# Patient Record
Sex: Female | Born: 1995 | Race: Black or African American | Hispanic: No | Marital: Single | Smoking: Never smoker
Health system: Southern US, Community
[De-identification: ages and names within clinical notes are randomized; demographics above are authoritative.]

## PROBLEM LIST (undated history)

## (undated) ENCOUNTER — Ambulatory Visit (HOSPITAL_COMMUNITY): Disposition: A | Discharge status: Home / Self Care | Payer: Medicaid Other

## (undated) DIAGNOSIS — O24419 Gestational diabetes mellitus in pregnancy, unspecified control: Secondary | ICD-10-CM

## (undated) DIAGNOSIS — Z8619 Personal history of other infectious and parasitic diseases: Secondary | ICD-10-CM

## (undated) HISTORY — PX: NO PAST SURGERIES: SHX2092

---
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
## 2018-11-05 DIAGNOSIS — O09299 Supervision of pregnancy with other poor reproductive or obstetric history, unspecified trimester: Secondary | ICD-10-CM

## 2018-11-05 HISTORY — DX: Supervision of pregnancy with other poor reproductive or obstetric history, unspecified trimester: O09.299

## 2018-11-05 NOTE — L&D Delivery Note (Signed)
Operative Delivery Note   At 5:26 PM a viable female, named Tara Nguyen,  was delivered via Vaginal, Spontaneous.  Presentation: vertex; Position: Left,, Occiput,, Anterior  Delivery of the head: 08/27/2019  5:24 PM First maneuver: 08/27/2019  5:24 PM, McRoberts Second maneuver: 08/27/2019  5:24 PM, Suprapubic Pressure Third maneuver: 08/27/2019  5:25 PM Woods Screw maneuver Fourth maneuver: 08/27/19 5:25 PM Failed attempt at posterior arm delivery Fifth maneuver: 08/27/19 5:25 PM Successful Woods Screw maneuver      APGAR: 4, 9; weight 3705 g ( 8 lbs 3 oz) Placenta status: spontaneous, complete, 3 Vx cord Cord pH: 7.08  Anesthesia:  Epidural Episiotomy: Median Lacerations: None Suture Repair: 3.0 Monocryl Est. Blood Loss (mL): 350  Mom to postpartum.  Baby to Couplet care / Skin to Skin. Outpatient circumcision desired Breastfeeding  Course of event:    Poorly controlled Class A2 diabetic patient, with presumed AGA baby,admitted in spontaneous labor. New diagnosis of pre-eclampsia with severe features during labor. Patient was complete at 16:36 I was present in the room for the whole 2nd stage of labor Patient pushed very efficiently and rapidly brought the baby's head to crowning. Turtle sign was recognized and 2 more nurses were called in the room for assistance.I informed everyone of the presentation of the baby in LOA and suspicion of imminent shoulder dystocia. Patient was already in [place] position. I performed a midline episiotomy. Head was delivered at 5:24 PM.  After short gentle traction downward, shoulder dystocia was recognized. NICU team and more assistance were requested with emergency call button I requested  Suprapubic pressure. I then attempted to move the anterior shoulder obliquely without success. Maneuvers were then performed as described while time countdown was given to me every 30 seconds. Shoulder delivery from head delivery: 1 minute 30 seconds After cord  clamping/cutting, baby was given to the NICU team in the room.  After repair of perineum was complete, I reviewed all delivery events with patient and accompanying cousin: per NICU evaluation, no clavicle fracture was identified. Movement of the right arm were noted although less than left arm. Bruising of the right ear and forehead was noted. Patient is informed that pediatrician will evaluate further. Questions were answered.  [name] 08/27/2019, 6:45 PM

## 2019-03-13 LAB — OB RESULTS CONSOLE HIV ANTIBODY (ROUTINE TESTING): HIV: NONREACTIVE

## 2019-03-13 LAB — OB RESULTS CONSOLE ABO/RH: RH Type: POSITIVE

## 2019-03-13 LAB — OB RESULTS CONSOLE GC/CHLAMYDIA
Chlamydia: NEGATIVE
Gonorrhea: NEGATIVE

## 2019-03-13 LAB — OB RESULTS CONSOLE RUBELLA ANTIBODY, IGM: Rubella: IMMUNE

## 2019-03-13 LAB — OB RESULTS CONSOLE HEPATITIS B SURFACE ANTIGEN: Hepatitis B Surface Ag: NEGATIVE

## 2019-03-13 LAB — OB RESULTS CONSOLE ANTIBODY SCREEN: Antibody Screen: NEGATIVE

## 2019-03-13 LAB — OB RESULTS CONSOLE RPR: RPR: NONREACTIVE

## 2019-07-07 DIAGNOSIS — O24419 Gestational diabetes mellitus in pregnancy, unspecified control: Secondary | ICD-10-CM

## 2019-07-07 HISTORY — DX: Gestational diabetes mellitus in pregnancy, unspecified control: O24.419

## 2019-07-29 ENCOUNTER — Encounter: Payer: Medicaid Other | Attending: Obstetrics and Gynecology | Admitting: Dietician

## 2019-07-29 ENCOUNTER — Encounter: Payer: Self-pay | Admitting: Dietician

## 2019-07-29 ENCOUNTER — Other Ambulatory Visit: Payer: Self-pay

## 2019-07-29 DIAGNOSIS — O24414 Gestational diabetes mellitus in pregnancy, insulin controlled: Secondary | ICD-10-CM | POA: Insufficient documentation

## 2019-07-29 NOTE — Progress Notes (Signed)
  Patient was seen on 07/29/2019 for Gestational Diabetes self-management class at the [facility]. The following learning objectives were met by the patient during this course:   States the definition of Gestational Diabetes  States why dietary management is important in controlling blood glucose  Describes the effects each nutrient has on blood glucose levels  Demonstrates ability to create a balanced meal plan  Demonstrates carbohydrate counting   States when to check blood glucose levels  Demonstrates proper blood glucose monitoring techniques  States the effect of stress and exercise on blood glucose levels  States the importance of limiting caffeine and abstaining from alcohol and smoking  Blood glucose monitor given: none Blood glucose reading: 267 in class.  She reports fasting this am was 122, and BG was 88 last night.  She states BG of 267 was due to sweetened drink that she brought to class.  Frequent urination due to increased BG.  Discussed to inform MD.  Patient instructed to monitor glucose levels: FBS: 60 - <90 1 hour: <140 2 hour: <120  *Patient received handouts:  Nutrition Diabetes and Pregnancy  Carbohydrate Counting List  Patient will be seen for follow-up as needed.

## 2019-08-06 ENCOUNTER — Inpatient Hospital Stay (HOSPITAL_COMMUNITY): Payer: Medicaid Other

## 2019-08-06 ENCOUNTER — Inpatient Hospital Stay (HOSPITAL_COMMUNITY)
Admission: AD | Admit: 2019-08-06 | Discharge: 2019-08-06 | Disposition: A | Discharge status: Home / Self Care | Payer: Medicaid Other | Attending: Obstetrics & Gynecology | Admitting: Obstetrics & Gynecology

## 2019-08-06 ENCOUNTER — Encounter (HOSPITAL_COMMUNITY): Payer: Self-pay

## 2019-08-06 DIAGNOSIS — O26893 Other specified pregnancy related conditions, third trimester: Secondary | ICD-10-CM | POA: Diagnosis not present

## 2019-08-06 DIAGNOSIS — Z3689 Encounter for other specified antenatal screening: Secondary | ICD-10-CM

## 2019-08-06 DIAGNOSIS — O99891 Other specified diseases and conditions complicating pregnancy: Secondary | ICD-10-CM | POA: Diagnosis not present

## 2019-08-06 DIAGNOSIS — O24414 Gestational diabetes mellitus in pregnancy, insulin controlled: Secondary | ICD-10-CM | POA: Diagnosis not present

## 2019-08-06 DIAGNOSIS — Z3A35 35 weeks gestation of pregnancy: Secondary | ICD-10-CM | POA: Diagnosis not present

## 2019-08-06 DIAGNOSIS — M549 Dorsalgia, unspecified: Secondary | ICD-10-CM | POA: Insufficient documentation

## 2019-08-06 LAB — CBC
HCT: 34.5 % — ABNORMAL LOW (ref 36.0–46.0)
Hemoglobin: 11.3 g/dL — ABNORMAL LOW (ref 12.0–15.0)
MCH: 26 pg (ref 26.0–34.0)
MCHC: 32.8 g/dL (ref 30.0–36.0)
MCV: 79.3 fL — ABNORMAL LOW (ref 80.0–100.0)
Platelets: 284 10*3/uL (ref 150–400)
RBC: 4.35 MIL/uL (ref 3.87–5.11)
RDW: 14.4 % (ref 11.5–15.5)
WBC: 11.4 10*3/uL — ABNORMAL HIGH (ref 4.0–10.5)
nRBC: 0 % (ref 0.0–0.2)

## 2019-08-06 LAB — COMPREHENSIVE METABOLIC PANEL
ALT: 14 U/L (ref 0–44)
AST: 20 U/L (ref 15–41)
Albumin: 2.8 g/dL — ABNORMAL LOW (ref 3.5–5.0)
Alkaline Phosphatase: 108 U/L (ref 38–126)
Anion gap: 12 (ref 5–15)
BUN: 5 mg/dL — ABNORMAL LOW (ref 6–20)
CO2: 19 mmol/L — ABNORMAL LOW (ref 22–32)
Calcium: 9 mg/dL (ref 8.9–10.3)
Chloride: 104 mmol/L (ref 98–111)
Creatinine, Ser: 0.76 mg/dL (ref 0.44–1.00)
GFR calc Af Amer: >60 mL/min (ref >60)
GFR calc non Af Amer: >60 mL/min (ref >60)
Glucose, Bld: 88 mg/dL (ref 70–99)
Potassium: 4 mmol/L (ref 3.5–5.1)
Sodium: 135 mmol/L (ref 135–145)
Total Bilirubin: 0.4 mg/dL (ref 0.3–1.2)
Total Protein: 6 g/dL — ABNORMAL LOW (ref 6.5–8.1)

## 2019-08-06 LAB — URINALYSIS, ROUTINE W REFLEX MICROSCOPIC
Bilirubin Urine: NEGATIVE
Glucose, UA: NEGATIVE mg/dL
Hgb urine dipstick: NEGATIVE
Ketones, ur: 20 mg/dL — AB
Leukocytes,Ua: NEGATIVE
Nitrite: NEGATIVE
Protein, ur: NEGATIVE mg/dL
Specific Gravity, Urine: 1.011 (ref 1.005–1.030)
pH: 8 (ref 5.0–8.0)

## 2019-08-06 LAB — GLUCOSE, CAPILLARY: Glucose-Capillary: 72 mg/dL (ref 70–99)

## 2019-08-06 MED ORDER — TRAMADOL-ACETAMINOPHEN 37.5-325 MG PO TABS
2.0000 | ORAL_TABLET | ORAL | Status: DC | PRN
  Filled 2019-08-06: qty 2

## 2019-08-06 MED ORDER — CYCLOBENZAPRINE HCL 10 MG PO TABS: 10.0000 mg | ORAL_TABLET | Freq: Two times a day (BID) | ORAL | 0 refills | Status: DC | PRN

## 2019-08-06 MED ORDER — CYCLOBENZAPRINE HCL 10 MG PO TABS
10.0000 mg | ORAL_TABLET | Freq: Once | ORAL | Status: AC
  Administered 2019-08-06: 15:00:00 10 mg via ORAL
  Filled 2019-08-06: qty 1

## 2019-08-06 MED ORDER — HYDROMORPHONE HCL 1 MG/ML IJ SOLN
0.5000 mg | Freq: Once | INTRAMUSCULAR | Status: AC
  Administered 2019-08-06: 0.5 mg via INTRAVENOUS
  Filled 2019-08-06: qty 1

## 2019-08-06 NOTE — MAU Note (Signed)
Pt complaining of contractions since 0300 this morning which have gotten worse. Unsure of how far apart. Denies LOF or bleeding. +FM/.

## 2019-08-06 NOTE — MAU Provider Note (Addendum)
Patient Tara Nguyen is a 23 y.o. G1P0 At [redacted]w[redacted]d here with complaints of  Contractions and sharp right flank pain.  She denies bleeding, decreased fetal movements, LOF. She denies HA, blurry vision or other ob-gyn complaint. She is a GDMA-2 on insulin, she is poorly controlled per Diabetic Educator notes.    History     CSN: [id number]  Arrival date and time: 08/06/19 1156   First Provider Initiated Contact with Patient 08/06/19 1219      Chief Complaint  Patient presents with  . Contractions   Back Pain This is a new problem. The current episode started yesterday. The problem occurs constantly. The problem has been resolved since onset. The quality of the pain is described as shooting. The pain does not radiate. The pain is at a severity of 10/10. Pertinent negatives include no abdominal pain, chest pain, dysuria or fever.   Patient presented to MAU in severe distress, unable to sit still on bed and crying out. She is writhing on the bed and saying she thinks she is in labor.   OB History    Gravida  1   Para      Term      Preterm      AB      Living        SAB      TAB      Ectopic      Multiple      Live Births              Past Medical History:  Diagnosis Date  . GDM (gestational diabetes mellitus) 07/2019    Past Surgical History:  Procedure Laterality Date  . NO PAST SURGERIES      No family history on file.  Social History   Tobacco Use  . Smoking status: Not on file  Substance Use Topics  . Alcohol use: Not on file  . Drug use: Not on file    Allergies: Not on File  Medications Prior to Admission  Medication Sig Dispense Refill Last Dose  . Prenatal Vit-Fe Fumarate-FA (PRENATAL MULTIVITAMIN) TABS tablet Take 1 tablet by mouth daily at 12 noon.   08/06/2019 at Unknown time  . insulin NPH Human (NOVOLIN N) 100 UNIT/ML injection Inject 10 Units into the skin at bedtime.       Review of Systems  Constitutional: Negative for fever.   Cardiovascular: Negative for chest pain.  Gastrointestinal: Negative for abdominal pain.  Genitourinary: Negative for dysuria.  Musculoskeletal: Positive for back pain.   Physical Exam   Blood pressure 120/79, pulse 79, temperature 97.9 F (36.6 C), temperature source Oral, resp. rate 18.  Physical Exam  Constitutional: She is oriented to person, place, and time. She appears well-developed and well-nourished.  HENT:  Head: Normocephalic.  Eyes: Pupils are equal, round, and reactive to light.  Neck: Normal range of motion.  Respiratory: Effort normal.  GI: Soft.  Genitourinary:    Genitourinary Comments: NEFG; cervix is FT, closed, thick.    Musculoskeletal: Normal range of motion.  Neurological: She is alert and oriented to person, place, and time.  Skin: Skin is warm and dry.  Patient appears to have some CVA tenderness on the right side.   MAU Course  Procedures  MDM Upon arrival in MAU, patient crying out in pain, IV started and stat labs drawn; once patient was informed that she was not in labor she appears more calm but still required pain medicine. She began asking  for food, a blanket and wanted to go to sleep. Because patient presented so acutely, she received 0.5 mg  of dilaudid before she went to [place] -US shows no signs of kidney stones -patient to get Flexeril and check blood sugars -If no relief, will try tramadol -Bedside CBG is 72 -CBC, UA, CMP normal. No signs of infection.  -NST: 125 bpm, mod var, present acel, neg decels, no contractions.  -Patient feels better after Flexeril; pain is now a 0/10. Patient is resting in bed , lying on her right side and reading on her phone.  Patient states "I think the baby is kicking me a lot; I think he was just in a bad position".  Patient's cervix was closed, FT and long on initial presentation to MAU, not rechecked before she left as patient had no complaint of pain.   Assessment and Plan   1. Back pain affecting  pregnancy, antepartum    -RX for Flexeril given, reassured patient that it is most likely MSK pain. Given that her pain responded will to Flexeril, she can try heat and flexeril at home. Reassured her of the normalcy of back pain at this gestation.  -She will keep follow up appt next week with [facility].  -Strict return precautions given; continue to follow insulin as prescribed by her OB.  Charlesetta Garibaldi Rayen Palen 08/06/2019, 2:47 PM

## 2019-08-06 NOTE — Discharge Instructions (Signed)

## 2019-08-10 LAB — OB RESULTS CONSOLE GBS: GBS: NEGATIVE

## 2019-08-25 ENCOUNTER — Encounter (HOSPITAL_COMMUNITY): Payer: Self-pay | Admitting: *Deleted

## 2019-08-25 ENCOUNTER — Telehealth (HOSPITAL_COMMUNITY): Payer: Self-pay | Admitting: *Deleted

## 2019-08-25 NOTE — Telephone Encounter (Signed)
Preadmission screen  

## 2019-08-27 ENCOUNTER — Inpatient Hospital Stay (HOSPITAL_COMMUNITY): Payer: Medicaid Other | Admitting: Anesthesiology

## 2019-08-27 ENCOUNTER — Other Ambulatory Visit: Payer: Self-pay

## 2019-08-27 ENCOUNTER — Inpatient Hospital Stay (HOSPITAL_COMMUNITY)
Admission: AD | Admit: 2019-08-27 | Discharge: 2019-08-30 | DRG: 807 | Disposition: A | Discharge status: Home / Self Care | Payer: Medicaid Other | Attending: Obstetrics & Gynecology | Admitting: Obstetrics & Gynecology

## 2019-08-27 ENCOUNTER — Encounter (HOSPITAL_COMMUNITY): Payer: Self-pay

## 2019-08-27 DIAGNOSIS — Z3A38 38 weeks gestation of pregnancy: Secondary | ICD-10-CM

## 2019-08-27 DIAGNOSIS — O26893 Other specified pregnancy related conditions, third trimester: Secondary | ICD-10-CM | POA: Diagnosis present

## 2019-08-27 DIAGNOSIS — O1495 Unspecified pre-eclampsia, complicating the puerperium: Secondary | ICD-10-CM | POA: Diagnosis not present

## 2019-08-27 DIAGNOSIS — O24424 Gestational diabetes mellitus in childbirth, insulin controlled: Secondary | ICD-10-CM | POA: Diagnosis present

## 2019-08-27 DIAGNOSIS — L509 Urticaria, unspecified: Secondary | ICD-10-CM | POA: Diagnosis not present

## 2019-08-27 DIAGNOSIS — O99214 Obesity complicating childbirth: Secondary | ICD-10-CM | POA: Diagnosis present

## 2019-08-27 DIAGNOSIS — O1415 Severe pre-eclampsia, complicating the puerperium: Secondary | ICD-10-CM | POA: Diagnosis present

## 2019-08-27 DIAGNOSIS — E669 Obesity, unspecified: Secondary | ICD-10-CM | POA: Diagnosis present

## 2019-08-27 DIAGNOSIS — Z20828 Contact with and (suspected) exposure to other viral communicable diseases: Secondary | ICD-10-CM | POA: Diagnosis present

## 2019-08-27 DIAGNOSIS — O24419 Gestational diabetes mellitus in pregnancy, unspecified control: Secondary | ICD-10-CM | POA: Diagnosis present

## 2019-08-27 LAB — CBC
HCT: 34.4 % — ABNORMAL LOW (ref 36.0–46.0)
HCT: 34.2 % — ABNORMAL LOW (ref 36.0–46.0)
Hemoglobin: 10.9 g/dL — ABNORMAL LOW (ref 12.0–15.0)
Hemoglobin: 11.1 g/dL — ABNORMAL LOW (ref 12.0–15.0)
MCH: 24.8 pg — ABNORMAL LOW (ref 26.0–34.0)
MCH: 25.2 pg — ABNORMAL LOW (ref 26.0–34.0)
MCHC: 31.7 g/dL (ref 30.0–36.0)
MCHC: 32.5 g/dL (ref 30.0–36.0)
MCV: 78.2 fL — ABNORMAL LOW (ref 80.0–100.0)
MCV: 77.7 fL — ABNORMAL LOW (ref 80.0–100.0)
Platelets: 278 10*3/uL (ref 150–400)
Platelets: 268 10*3/uL (ref 150–400)
RBC: 4.4 MIL/uL (ref 3.87–5.11)
RBC: 4.4 MIL/uL (ref 3.87–5.11)
RDW: 14.9 % (ref 11.5–15.5)
RDW: 15.1 % (ref 11.5–15.5)
WBC: 10.3 10*3/uL (ref 4.0–10.5)
WBC: 15.7 10*3/uL — ABNORMAL HIGH (ref 4.0–10.5)
nRBC: 0 % (ref 0.0–0.2)
nRBC: 0 % (ref 0.0–0.2)

## 2019-08-27 LAB — GLUCOSE, CAPILLARY
Glucose-Capillary: 111 mg/dL — ABNORMAL HIGH (ref 70–99)
Glucose-Capillary: 115 mg/dL — ABNORMAL HIGH (ref 70–99)

## 2019-08-27 LAB — SARS CORONAVIRUS 2 BY RT PCR (HOSPITAL ORDER, PERFORMED IN ~~LOC~~ HOSPITAL LAB): SARS Coronavirus 2: NEGATIVE

## 2019-08-27 LAB — TYPE AND SCREEN
ABO/RH(D): A POS
Antibody Screen: NEGATIVE

## 2019-08-27 LAB — COMPREHENSIVE METABOLIC PANEL
ALT: 15 U/L (ref 0–44)
AST: 18 U/L (ref 15–41)
Albumin: 2.8 g/dL — ABNORMAL LOW (ref 3.5–5.0)
Alkaline Phosphatase: 130 U/L — ABNORMAL HIGH (ref 38–126)
Anion gap: 13 (ref 5–15)
BUN: 6 mg/dL (ref 6–20)
CO2: 21 mmol/L — ABNORMAL LOW (ref 22–32)
Calcium: 8.6 mg/dL — ABNORMAL LOW (ref 8.9–10.3)
Chloride: 104 mmol/L (ref 98–111)
Creatinine, Ser: 0.9 mg/dL (ref 0.44–1.00)
GFR calc Af Amer: >60 mL/min (ref >60)
GFR calc non Af Amer: >60 mL/min (ref >60)
Glucose, Bld: 116 mg/dL — ABNORMAL HIGH (ref 70–99)
Potassium: 3.8 mmol/L (ref 3.5–5.1)
Sodium: 138 mmol/L (ref 135–145)
Total Bilirubin: 0.7 mg/dL (ref 0.3–1.2)
Total Protein: 6 g/dL — ABNORMAL LOW (ref 6.5–8.1)

## 2019-08-27 LAB — RPR: RPR Ser Ql: NONREACTIVE

## 2019-08-27 LAB — PROTEIN / CREATININE RATIO, URINE
Creatinine, Urine: 50.76 mg/dL
Protein Creatinine Ratio: 0.59 mg/mg{Cre} — ABNORMAL HIGH (ref 0.00–0.15)
Total Protein, Urine: 30 mg/dL

## 2019-08-27 LAB — ABO/RH: ABO/RH(D): A POS

## 2019-08-27 MED ORDER — MAGNESIUM SULFATE 2 GM/50ML IV SOLN: 2.0000 g | Freq: Once | INTRAVENOUS | Status: DC

## 2019-08-27 MED ORDER — LABETALOL HCL 5 MG/ML IV SOLN
20.0000 mg | INTRAVENOUS | Status: DC | PRN
  Administered 2019-08-27 (×2): 20 mg via INTRAVENOUS
  Filled 2019-08-27 (×2): qty 4

## 2019-08-27 MED ORDER — CEPHALEXIN 500 MG PO CAPS
500.0000 mg | ORAL_CAPSULE | Freq: Two times a day (BID) | ORAL | Status: DC
  Administered 2019-08-28 – 2019-08-29 (×5): 500 mg via ORAL
  Filled 2019-08-27 (×6): qty 1

## 2019-08-27 MED ORDER — PRENATAL MULTIVITAMIN CH
1.0000 | ORAL_TABLET | Freq: Every day | ORAL | Status: DC
  Administered 2019-08-28 – 2019-08-29 (×2): 1 via ORAL
  Filled 2019-08-27 (×2): qty 1

## 2019-08-27 MED ORDER — WITCH HAZEL-GLYCERIN EX PADS: 1.0000 "application " | MEDICATED_PAD | CUTANEOUS | Status: DC | PRN

## 2019-08-27 MED ORDER — DIPHENHYDRAMINE HCL 25 MG PO CAPS
25.0000 mg | ORAL_CAPSULE | Freq: Four times a day (QID) | ORAL | Status: DC | PRN
  Administered 2019-08-29 – 2019-08-30 (×5): 25 mg via ORAL
  Filled 2019-08-27 (×5): qty 1

## 2019-08-27 MED ORDER — FENTANYL-BUPIVACAINE-NACL 0.5-0.125-0.9 MG/250ML-% EP SOLN: 12.0000 mL/h | EPIDURAL | Status: DC | PRN

## 2019-08-27 MED ORDER — LIDOCAINE HCL (PF) 1 % IJ SOLN
INTRAMUSCULAR | Status: DC | PRN
  Administered 2019-08-27 (×2): 4 mL via EPIDURAL

## 2019-08-27 MED ORDER — OXYCODONE-ACETAMINOPHEN 5-325 MG PO TABS: 1.0000 | ORAL_TABLET | ORAL | Status: DC | PRN

## 2019-08-27 MED ORDER — LACTATED RINGERS IV SOLN
500.0000 mL | INTRAVENOUS | Status: DC | PRN
  Administered 2019-08-27: 500 mL via INTRAVENOUS

## 2019-08-27 MED ORDER — MEASLES, MUMPS & RUBELLA VAC IJ SOLR: 0.5000 mL | Freq: Once | INTRAMUSCULAR | Status: DC

## 2019-08-27 MED ORDER — EPHEDRINE 5 MG/ML INJ: 10.0000 mg | INTRAVENOUS | Status: DC | PRN

## 2019-08-27 MED ORDER — ONDANSETRON HCL 4 MG/2ML IJ SOLN: 4.0000 mg | INTRAMUSCULAR | Status: DC | PRN

## 2019-08-27 MED ORDER — ONDANSETRON HCL 4 MG/2ML IJ SOLN: 4.0000 mg | Freq: Four times a day (QID) | INTRAMUSCULAR | Status: DC | PRN

## 2019-08-27 MED ORDER — LIDOCAINE HCL (PF) 1 % IJ SOLN: 30.0000 mL | INTRAMUSCULAR | Status: DC | PRN

## 2019-08-27 MED ORDER — BUPIVACAINE HCL (PF) 0.25 % IJ SOLN
INTRAMUSCULAR | Status: DC | PRN
  Administered 2019-08-27: 7 mL via EPIDURAL

## 2019-08-27 MED ORDER — IBUPROFEN 600 MG PO TABS
600.0000 mg | ORAL_TABLET | Freq: Four times a day (QID) | ORAL | Status: DC
  Administered 2019-08-28 – 2019-08-30 (×8): 600 mg via ORAL
  Filled 2019-08-27 (×8): qty 1

## 2019-08-27 MED ORDER — ENOXAPARIN SODIUM 40 MG/0.4ML ~~LOC~~ SOLN
40.0000 mg | SUBCUTANEOUS | Status: DC
  Administered 2019-08-28 – 2019-08-30 (×3): 40 mg via SUBCUTANEOUS
  Filled 2019-08-27 (×3): qty 0.4

## 2019-08-27 MED ORDER — LACTATED RINGERS IV SOLN
500.0000 mL | Freq: Once | INTRAVENOUS | Status: AC
  Administered 2019-08-27: 500 mL via INTRAVENOUS

## 2019-08-27 MED ORDER — FLEET ENEMA 7-19 GM/118ML RE ENEM: 1.0000 | ENEMA | RECTAL | Status: DC | PRN

## 2019-08-27 MED ORDER — TETANUS-DIPHTH-ACELL PERTUSSIS 5-2.5-18.5 LF-MCG/0.5 IM SUSP: 0.5000 mL | Freq: Once | INTRAMUSCULAR | Status: DC

## 2019-08-27 MED ORDER — LACTATED RINGERS IV SOLN
INTRAVENOUS | Status: DC
  Administered 2019-08-27 (×3): via INTRAVENOUS

## 2019-08-27 MED ORDER — DIBUCAINE (PERIANAL) 1 % EX OINT: 1.0000 "application " | TOPICAL_OINTMENT | CUTANEOUS | Status: DC | PRN

## 2019-08-27 MED ORDER — DIPHENHYDRAMINE HCL 50 MG/ML IJ SOLN: 12.5000 mg | INTRAMUSCULAR | Status: DC | PRN

## 2019-08-27 MED ORDER — PHENYLEPHRINE 40 MCG/ML (10ML) SYRINGE FOR IV PUSH (FOR BLOOD PRESSURE SUPPORT): 80.0000 ug | PREFILLED_SYRINGE | INTRAVENOUS | Status: DC | PRN

## 2019-08-27 MED ORDER — SODIUM CHLORIDE (PF) 0.9 % IJ SOLN
INTRAMUSCULAR | Status: DC | PRN
  Administered 2019-08-27: 11 mL/h via EPIDURAL

## 2019-08-27 MED ORDER — ACETAMINOPHEN 325 MG PO TABS: 650.0000 mg | ORAL_TABLET | ORAL | Status: DC | PRN

## 2019-08-27 MED ORDER — OXYTOCIN BOLUS FROM INFUSION
500.0000 mL | Freq: Once | INTRAVENOUS | Status: AC
  Administered 2019-08-27: 500 mL via INTRAVENOUS

## 2019-08-27 MED ORDER — COCONUT OIL OIL: 1.0000 "application " | TOPICAL_OIL | Status: DC | PRN

## 2019-08-27 MED ORDER — ACETAMINOPHEN 325 MG PO TABS
650.0000 mg | ORAL_TABLET | ORAL | Status: DC | PRN
  Administered 2019-08-28 (×2): 650 mg via ORAL
  Filled 2019-08-27 (×2): qty 2

## 2019-08-27 MED ORDER — LACTATED RINGERS IV SOLN: 500.0000 mL | Freq: Once | INTRAVENOUS | Status: DC

## 2019-08-27 MED ORDER — FENTANYL-BUPIVACAINE-NACL 0.5-0.125-0.9 MG/250ML-% EP SOLN
EPIDURAL | Status: AC
  Filled 2019-08-27: qty 250

## 2019-08-27 MED ORDER — SIMETHICONE 80 MG PO CHEW: 80.0000 mg | CHEWABLE_TABLET | ORAL | Status: DC | PRN

## 2019-08-27 MED ORDER — MAGNESIUM SULFATE BOLUS VIA INFUSION
4.0000 g | Freq: Once | INTRAVENOUS | Status: AC
  Administered 2019-08-27: 4 g via INTRAVENOUS
  Filled 2019-08-27: qty 1000

## 2019-08-27 MED ORDER — MAGNESIUM SULFATE 40 GM/1000ML IV SOLN
2.0000 g/h | INTRAVENOUS | Status: DC
  Administered 2019-08-27 – 2019-08-28 (×2): 2 g/h via INTRAVENOUS
  Filled 2019-08-27 (×2): qty 1000

## 2019-08-27 MED ORDER — PHENYLEPHRINE 40 MCG/ML (10ML) SYRINGE FOR IV PUSH (FOR BLOOD PRESSURE SUPPORT)
PREFILLED_SYRINGE | INTRAVENOUS | Status: AC
  Filled 2019-08-27: qty 10

## 2019-08-27 MED ORDER — MAGNESIUM SULFATE 40 GM/1000ML IV SOLN: 2.0000 g/h | Freq: Once | INTRAVENOUS | Status: DC

## 2019-08-27 MED ORDER — OXYTOCIN 40 UNITS IN NORMAL SALINE INFUSION - SIMPLE MED
2.5000 [IU]/h | INTRAVENOUS | Status: DC
  Filled 2019-08-27: qty 1000

## 2019-08-27 MED ORDER — OXYCODONE-ACETAMINOPHEN 5-325 MG PO TABS: 2.0000 | ORAL_TABLET | ORAL | Status: DC | PRN

## 2019-08-27 MED ORDER — BENZOCAINE-MENTHOL 20-0.5 % EX AERO
1.0000 "application " | INHALATION_SPRAY | CUTANEOUS | Status: DC | PRN
  Filled 2019-08-27: qty 56

## 2019-08-27 MED ORDER — SENNOSIDES-DOCUSATE SODIUM 8.6-50 MG PO TABS
2.0000 | ORAL_TABLET | ORAL | Status: DC
  Administered 2019-08-28 – 2019-08-29 (×2): 2 via ORAL
  Filled 2019-08-27 (×2): qty 2

## 2019-08-27 MED ORDER — SOD CITRATE-CITRIC ACID 500-334 MG/5ML PO SOLN: 30.0000 mL | ORAL | Status: DC | PRN

## 2019-08-27 MED ORDER — FERROUS SULFATE 325 (65 FE) MG PO TABS
325.0000 mg | ORAL_TABLET | Freq: Two times a day (BID) | ORAL | Status: DC
  Administered 2019-08-28 – 2019-08-30 (×5): 325 mg via ORAL
  Filled 2019-08-27 (×5): qty 1

## 2019-08-27 MED ORDER — ZOLPIDEM TARTRATE 5 MG PO TABS: 5.0000 mg | ORAL_TABLET | Freq: Every evening | ORAL | Status: DC | PRN

## 2019-08-27 MED ORDER — ONDANSETRON HCL 4 MG PO TABS: 4.0000 mg | ORAL_TABLET | ORAL | Status: DC | PRN

## 2019-08-27 NOTE — Consult Note (Signed)
Neonatology Note:   Attendance at Delivery:    I was asked by Dr. Cletis Media to attend this delivery at term for shoulder dystocia. The mother is a G2P0010, GBS neg with good prenatal care complicated by GDM on insulin, obesity and previous THC use. ROM 12h 23m prior to delivery, fluid clear. Infant apneic and hypotonic at birth.  Immediately broughtt to warmer and dreed and stimulated.  HR <60.  PPV started with good response.  Baby began to cry and tone gradually improved.  PPV stopped and short course BBO2 given then RA as Sao2 levels appropriate. Received minimal bulb suctioning. Clavicles intact, spontaneous movement of both arms, though LT > RT.  Apparent bruising over right upper cheek, crossing eyelid and to forehead.  Ap 4/9. Lungs clear to ausc in DR. Family updated.  To CN to care of Pediatrician.  Monia Sabal Katherina Mires, MD

## 2019-08-27 NOTE — Progress Notes (Signed)
Tula Schryver is a 23 y.o. G2P0010 at 40w2dadmitted for active labor  Subjective:  Now getting comfortable with  Epidural: was redosed at 11:12 Contractions every 1-2 minutes, lasting 40 seconds, intensity 4/10    Objective: BP 136/87   Pulse 100   Temp 97.8 F (36.6 C) (Oral)   Ht 5' (1.524 m)   Wt 89.8 kg   SpO2 100%   BMI 38.67 kg/m  No intake/output data recorded. Total I/O In: -  Out: 350 [Urine:350]  FHT:  Shortly after redosing of epidural, FHR with repetitive late decelerations. IV bolus given, change of position to right side, scalp lead placed.           Decelerations now resolved with baseline 150-155, minimal variability but had good scalp stimulation response MVU: 160 SVE:   Dilation: 6.5 Effacement (%): 100 Station: -1 Exam by:: Dr. Cletis Media  Labs: Lab Results  Component Value Date   WBC 10.3 08/27/2019   HGB 10.9 (L) 08/27/2019   HCT 34.4 (L) 08/27/2019   MCV 78.2 (L) 08/27/2019   PLT 278 08/27/2019    Assessment / Plan: Spontaneous labor, progressing normally Fetal Wellbeing: reassuring Anticipated MOD:  NSVD  Katharine Look A Elijiah Mickley 08/27/2019, 12:02 PM

## 2019-08-27 NOTE — H&P (Signed)
Tara Nguyen is a 23 y.o. female, G2P0 at 38+2 weeks  presenting for active labor with SROM .First evaluation at MAU with cervix at 6 cm, clear fluid.  BP elevated since arrival and despite adequate pain management,Max at 175/99.Patient denies headache, visual SX or epigastric pain.  Pregnancy followed at [facility] since 25+1  weeks transferred from [facility] and remarkable for:  1. Class A2 diabetes on NPH 22 U am and HS. Last U?S 1021-20 with EFW at 7 lbs 7 oz with normal AFI 2. BMI 39 3. Previous Marijuana user  OB History    Gravida  2   Para      Term      Preterm      AB  1   Living        SAB  1   TAB      Ectopic      Multiple      Live Births             Past Medical History:  Diagnosis Date  . GDM (gestational diabetes mellitus) 07/2019   Past Surgical History:  Procedure Laterality Date  . NO PAST SURGERIES      Family History:  Mother: sarcoidosis, diabetes, CHF  Social History:    reports that she has never smoked. She has never used smokeless tobacco. She reports that she does not drink alcohol or use drugs.   Prenatal labs: ABO, Rh: --/--/A POS, A POS Performed at [facility], [place] [address], [place], [place] [address]  [phone]) Antibody: NEG (10/22 0753) Rubella: Immune (05/08 0000) RPR: Nonreactive (05/08 0000)  HBsAg: Negative (05/08 0000)  HIV: Non-reactive (05/08 0000)  GBS: Negative/-- (10/05 0000)    Prenatal Transfer Tool  Maternal Diabetes: Yes:  Diabetes Type:  Insulin/Medication controlled Genetic Screening: Normal Maternal Ultrasounds/Referrals: Normal Fetal Ultrasounds or other Referrals:  None Maternal Substance Abuse:  No Significant Maternal Medications:  Meds include: Other: NPH 22 U BID Significant Maternal Lab Results: Group B Strep negative    Blood pressure (!) 153/108, pulse 88, temperature 97.8 F (36.6 C), temperature source Oral, SpO2 100 %.  General Appearance: Alert, appropriate  appearance for age. No acute distress HEENT Exam: Grossly normal Chest/Respiratory Exam: Normal chest wall and respirations. Clear to auscultation  Cardiovascular Exam: Regular rate and rhythm. S1, S2, no murmur Gastrointestinal Exam: soft, non-tender, Uterus gravid with size compatible with GA, Vertex presentation by Leopold's maneuvers Psychiatric Exam: Alert and oriented, appropriate affect Extremities: 2+ edema, DTR brisk, No clonus  ++++++++++++++++++++++++++++++++++++++++++++++++++++++++++++++++  Vaginal exam: 4/100/-2  Fetal tracings: Category 1  ++++++++++++++++++++++++++++++++++++++++++++++++++++++++++++++++   Assessment/Plan:  38+2 weeks Class A2 diabetes with AGA 08/26/19 Not comfortable with epidural: continue bolusing until comfortable Elevated BP: PIH labs pending with IV Labetalol protocol Fetal tracing:Category 1 IUPC inserted easily SVD expected   Delsa Bern MD 08/27/2019, 9:48 AM

## 2019-08-27 NOTE — Anesthesia Preprocedure Evaluation (Signed)
Anesthesia Evaluation  Patient identified by MRN, date of birth, ID band Patient awake    Reviewed: Allergy & Precautions, Patient's Chart, lab work & pertinent test results  History of Anesthesia Complications Negative for: history of anesthetic complications  Airway Mallampati: II  TM Distance: >3 FB Neck ROM: Full    Dental no notable dental hx.    Pulmonary neg pulmonary ROS,    Pulmonary exam normal        Cardiovascular negative cardio ROS Normal cardiovascular exam     Neuro/Psych negative neurological ROS  negative psych ROS   GI/Hepatic negative GI ROS, Neg liver ROS,   Endo/Other  diabetes, Gestational  Renal/GU negative Renal ROS  negative genitourinary   Musculoskeletal negative musculoskeletal ROS (+)   Abdominal   Peds  Hematology negative hematology ROS (+)   Anesthesia Other Findings Day of surgery medications reviewed with patient.  Reproductive/Obstetrics (+) Pregnancy                             Anesthesia Physical Anesthesia Plan  ASA: II  Anesthesia Plan: Epidural   Post-op Pain Management:    Induction:   PONV Risk Score and Plan: Treatment may vary due to age or medical condition  Airway Management Planned: Natural Airway  Additional Equipment:   Intra-op Plan:   Post-operative Plan:   Informed Consent: I have reviewed the patients History and Physical, chart, labs and discussed the procedure including the risks, benefits and alternatives for the proposed anesthesia with the patient or authorized representative who has indicated his/her understanding and acceptance.       Plan Discussed with:   Anesthesia Plan Comments:         Anesthesia Quick Evaluation  

## 2019-08-27 NOTE — MAU Note (Signed)
Pt arrived via ems for CTX and SROM at 0500.  Hx Gestational diabetes.

## 2019-08-27 NOTE — Anesthesia Procedure Notes (Signed)
Epidural Patient location during procedure: OB Start time: 08/27/2019 8:55 AM End time: 08/27/2019 8:58 AM  Staffing Anesthesiologist: Brennan Bailey, MD Performed: anesthesiologist   Preanesthetic Checklist Completed: patient identified, pre-op evaluation, timeout performed, IV checked, risks and benefits discussed and monitors and equipment checked  Epidural Patient position: sitting Prep: site prepped and draped and DuraPrep Patient monitoring: continuous pulse ox, blood pressure and heart rate Approach: midline Location: L3-L4 Injection technique: LOR air  Needle:  Needle type: Tuohy  Needle gauge: 17 G Needle length: 9 cm Needle insertion depth: 9 cm Catheter type: closed end flexible Catheter size: 19 Gauge Catheter at skin depth: 14 cm Test dose: negative and Other (1% lidocaine)  Assessment Events: blood not aspirated, injection not painful, no injection resistance, negative IV test and no paresthesia  Additional Notes Patient identified. Risks, benefits, and alternatives discussed with patient including but not limited to bleeding, infection, nerve damage, paralysis, failed block, incomplete pain control, headache, blood pressure changes, nausea, vomiting, reactions to medication, itching, and postpartum back pain. Confirmed with bedside nurse the patient's most recent platelet count. Confirmed with patient that they are not currently taking any anticoagulation, have any bleeding history, or any family history of bleeding disorders. Patient expressed understanding and wished to proceed. All questions were answered. Sterile technique was used throughout the entire procedure. Please see nursing notes for vital signs.   Crisp LOR after one needle redirection. Test dose was given through epidural catheter and negative prior to continuing to dose epidural or start infusion. Warning signs of high block given to the patient including shortness of breath, tingling/numbness in  hands, complete motor block, or any concerning symptoms with instructions to call for help. Patient was given instructions on fall risk and not to get out of bed. All questions and concerns addressed with instructions to call with any issues or inadequate analgesia.  Reason for block:procedure for pain

## 2019-08-28 LAB — CBC
HCT: 32.8 % — ABNORMAL LOW (ref 36.0–46.0)
Hemoglobin: 10.9 g/dL — ABNORMAL LOW (ref 12.0–15.0)
MCH: 25.3 pg — ABNORMAL LOW (ref 26.0–34.0)
MCHC: 33.2 g/dL (ref 30.0–36.0)
MCV: 76.1 fL — ABNORMAL LOW (ref 80.0–100.0)
Platelets: 250 10*3/uL (ref 150–400)
RBC: 4.31 MIL/uL (ref 3.87–5.11)
RDW: 14.8 % (ref 11.5–15.5)
WBC: 16.5 10*3/uL — ABNORMAL HIGH (ref 4.0–10.5)
nRBC: 0 % (ref 0.0–0.2)

## 2019-08-28 LAB — COMPREHENSIVE METABOLIC PANEL
ALT: 17 U/L (ref 0–44)
AST: 33 U/L (ref 15–41)
Albumin: 2.5 g/dL — ABNORMAL LOW (ref 3.5–5.0)
Alkaline Phosphatase: 123 U/L (ref 38–126)
Anion gap: 10 (ref 5–15)
BUN: <5 mg/dL — ABNORMAL LOW (ref 6–20)
CO2: 22 mmol/L (ref 22–32)
Calcium: 8.5 mg/dL — ABNORMAL LOW (ref 8.9–10.3)
Chloride: 102 mmol/L (ref 98–111)
Creatinine, Ser: 0.87 mg/dL (ref 0.44–1.00)
GFR calc Af Amer: >60 mL/min (ref >60)
GFR calc non Af Amer: >60 mL/min (ref >60)
Glucose, Bld: 81 mg/dL (ref 70–99)
Potassium: 3.2 mmol/L — ABNORMAL LOW (ref 3.5–5.1)
Sodium: 134 mmol/L — ABNORMAL LOW (ref 135–145)
Total Bilirubin: 0.8 mg/dL (ref 0.3–1.2)
Total Protein: 5.6 g/dL — ABNORMAL LOW (ref 6.5–8.1)

## 2019-08-28 LAB — GLUCOSE, CAPILLARY
Glucose-Capillary: 73 mg/dL (ref 70–99)
Glucose-Capillary: 80 mg/dL (ref 70–99)
Glucose-Capillary: 136 mg/dL — ABNORMAL HIGH (ref 70–99)

## 2019-08-28 MED ORDER — LACTATED RINGERS IV SOLN
INTRAVENOUS | Status: DC
  Administered 2019-08-28 (×2): via INTRAVENOUS

## 2019-08-28 MED ORDER — POTASSIUM CHLORIDE CRYS ER 20 MEQ PO TBCR
40.0000 meq | EXTENDED_RELEASE_TABLET | Freq: Two times a day (BID) | ORAL | Status: DC
  Administered 2019-08-28 – 2019-08-30 (×4): 40 meq via ORAL
  Filled 2019-08-28 (×4): qty 2

## 2019-08-28 NOTE — Progress Notes (Signed)
CSW acknowledged consult and attempted to meet with MOB, however MOB is currently on magnesium. CSW will see MOB once magnesium is discontinued.   Ashelyn Mccravy, LCSW Clinical Social Worker [facility] Cell#: [phone]  

## 2019-08-28 NOTE — Progress Notes (Addendum)
PPD# 1 SVD w/ pre-eclampsia w/ severe features on MgSO4 Vaginal delivery w/ shoulder dystocia requiring maneuvers and midline epis Information for the patient's newborn:  [place], Kosier [[id number]]  female    Baby Name: Earley Brooke Circumcision: Out-patient   S:   Reports feeling very tired, denies HA, visual changes, and RUQ pain Tolerating PO fluid and solids No nausea or vomiting Bleeding is moderate, no clots Pain controlled with acetaminophen Up ad lib / ambulatory / voiding w/o difficulty Bottle and Breast    O:   VS: BP (!) 145/84   Pulse 89   Temp 98.2 F (36.8 C) (Axillary)   Resp 19   Ht 5' (1.524 m)   Wt 89.8 kg   SpO2 100%   Breastfeeding Unknown   BMI 38.67 kg/m   LABS:  Recent Labs    08/27/19 1834 08/28/19 0638  WBC 15.7* 16.5*  HGB 11.1* 10.9*  PLT 268 250   CMP     Component Value Date/Time   NA 134 (L) 08/28/2019 0638   K 3.2 (L) 08/28/2019 0638   CL 102 08/28/2019 0638   CO2 22 08/28/2019 0638   GLUCOSE 81 08/28/2019 0638   BUN <5 (L) 08/28/2019 0638   CREATININE 0.87 08/28/2019 0638   CALCIUM 8.5 (L) 08/28/2019 0638   PROT 5.6 (L) 08/28/2019 0638   ALBUMIN 2.5 (L) 08/28/2019 0638   AST 33 08/28/2019 0638   ALT 17 08/28/2019 0638   ALKPHOS 123 08/28/2019 0638   BILITOT 0.8 08/28/2019 0638   GFRNONAA >60 08/28/2019 0638   GFRAA >60 08/28/2019 0638   CBG (last 3)  Recent Labs    08/27/19 0954 08/27/19 1419 08/28/19 0629  GLUCAP 111* 115* 73    Blood type: --/--/A POS, A POS Performed at [facility], [place] [address] [place], [place] [address]  [phone]) Rubella: Immune (05/08 0000)                      I&O: Intake/Output      10/22 0701 - 10/23 0700 10/23 0701 - 10/24 0700   P.O. 1720    I.V. (mL/kg) 3511 (39.1)    Total Intake(mL/kg) 5231 (58.3)    Urine (mL/kg/hr) 9050    Emesis/NG output 0    Blood 350    Total Output 9400    Net -4169         Urine Occurrence 1 x    Emesis Occurrence 720 x       Physical Exam: Alert and oriented X3 Lungs: Clear and unlabored Heart: regular rate and rhythm, no mumurs Abdomen: soft, non-tender, non-distended  Fundus: firm, non-tender, below U Perineum: well-approximated and healing Lochia: appropriate Extremities: trace, non-pitting edema, no calf pain or tenderness Neuro: AAO x3, reflexes +1, negative clonus    A/P:  PPD # 1     -stable     -continue routine PP orders  Pre-eclampsia w/ severe features     -currently on MgSO4, D/C today @ 1910     -severe range BP x2 in past 12 hrs, responded well to IV Labetalol     -Plan to DC home on Labetalol 200 mg BID  A2DM     -poorly controlled in preg     -fasting and PP CBG postpartum     -will determine need for insulin based on CBG values  Anticipate D/C on 10/24   Plan reviewed w/ Dr. Barry Dienes, MSN, CNM 08/28/2019,  7:48 AM

## 2019-08-28 NOTE — Lactation Note (Signed)
This note was copied from a baby's chart. Lactation Consultation Note  Patient Name: Tara Nguyen [id number] Date: 08/28/2019 Reason for consult: Initial assessment;1st time breastfeeding;Primapara;Early term 37-38.6wks;Maternal endocrine disorder;Other (Comment)(baby birth weight 8-2.7 oz - see LC note) Type of Endocrine Disorder?: Diabetes  Baby is 71 hours old , ( right ram decreased tone compared to the left - ( Shoulder dystocia ) As had attempts to latch and several bottles.  1st 2 blood sugars - 43 , and 40 .  At 0546 Bili check - 9.2 at 12 hours , serum at 0629 - 7.3 .  As [facility] consult - baby awake and mom receptive to [facility] assist to  [place] and baby fed for 8 mins with swallows and noted to be non - nutritive so  LC released baby - Nipple well rounded and per mom comfortable with the latch.  Per mom active with [facility] and is aware she needs to transfer over to [place].  Per mom has a DEBP from her Cousin , not sure of the make.       Maternal Data Has patient been taught Hand Expression?: Yes  Feeding Feeding Type: Breast Fed Nipple Type: Slow - flow  LATCH Score Latch: Grasps breast easily, tongue down, lips flanged, rhythmical sucking.  Audible Swallowing: A few with stimulation(increased to 2  with heat and compressions)  Type of Nipple: Everted at rest and after stimulation  Comfort (Breast/Nipple): Soft / non-tender  Hold (Positioning): Assistance needed to correctly position infant at breast and maintain latch.  LATCH Score: 8  Interventions Interventions: Breast feeding basics reviewed;Assisted with latch;Skin to skin;Breast massage;Hand express;Breast compression;Adjust position;Support pillows;Position options  Lactation Tools Discussed/Used     Consult Status Consult Status: Follow-up Date: 08/29/19 Follow-up type: In-patient    [place] 08/28/2019, 9:53 AM

## 2019-08-28 NOTE — Anesthesia Postprocedure Evaluation (Signed)
Anesthesia Post Note  Patient: Tara Nguyen  Procedure(s) Performed: AN AD [place]     Patient location during evaluation: Mother Baby Anesthesia Type: Epidural Level of consciousness: awake and alert Pain management: pain level controlled Vital Signs Assessment: post-procedure vital signs reviewed and stable Respiratory status: spontaneous breathing, nonlabored ventilation and respiratory function stable Cardiovascular status: stable Postop Assessment: no headache, no backache and epidural receding Anesthetic complications: no    Last Vitals:  Vitals:   08/28/19 0405 08/28/19 0807  BP: (!) 145/84 (!) 142/89  Pulse: 89 90  Resp: 19 18  Temp:  36.6 C  SpO2: 100% 100%    Last Pain:  Vitals:   08/28/19 0807  TempSrc: Oral  PainSc:    Pain Goal: Patients Stated Pain Goal: 3 (08/28/19 0405)                 [place] [employer]

## 2019-08-29 LAB — GLUCOSE, CAPILLARY
Glucose-Capillary: 88 mg/dL (ref 70–99)
Glucose-Capillary: 88 mg/dL (ref 70–99)
Glucose-Capillary: 98 mg/dL (ref 70–99)
Glucose-Capillary: 174 mg/dL — ABNORMAL HIGH (ref 70–99)

## 2019-08-29 MED ORDER — NIFEDIPINE ER OSMOTIC RELEASE 30 MG PO TB24
60.0000 mg | ORAL_TABLET | Freq: Every day | ORAL | Status: DC
  Administered 2019-08-29 – 2019-08-30 (×2): 60 mg via ORAL
  Filled 2019-08-29 (×2): qty 2

## 2019-08-29 MED ORDER — DIPHENHYDRAMINE HCL 50 MG/ML IJ SOLN: 50.0000 mg | Freq: Once | INTRAMUSCULAR | Status: DC

## 2019-08-29 MED ORDER — DIPHENHYDRAMINE HCL 50 MG/ML IJ SOLN
50.0000 mg | Freq: Once | INTRAMUSCULAR | Status: AC
  Administered 2019-08-29: 50 mg via INTRAMUSCULAR
  Filled 2019-08-29: qty 1

## 2019-08-29 NOTE — Progress Notes (Addendum)
PPD# 2 SVD w/ shoulder dystocia. Pre-eclampsia w/ severe features S/P MgSO4 Information for the patient's newborn:  Amea, Mcphail [[id number]]  female    Baby Name Kenzo Circumcision Out-pt w/ [facility]   S:   Reports feeling "really good" Tolerating PO fluid and solids No nausea or vomiting Bleeding is moderate, no clots Pain controlled with PO meds Up ad lib / ambulatory / voiding w/o difficulty Bottle and Breast    O:   VS: BP (!) 149/92 (BP Location: Right Arm)   Pulse 83   Temp 99 F (37.2 C) (Oral)   Resp 18   Ht 5' (1.524 m)   Wt 89.8 kg   SpO2 99%   Breastfeeding Unknown   BMI 38.67 kg/m   LABS:  Recent Labs    08/27/19 1834 08/28/19 0638  WBC 15.7* 16.5*  HGB 11.1* 10.9*  PLT 268 250   CMP     Component Value Date/Time   NA 134 (L) 08/28/2019 0638   K 3.2 (L) 08/28/2019 0638   CL 102 08/28/2019 0638   CO2 22 08/28/2019 0638   GLUCOSE 81 08/28/2019 0638   BUN <5 (L) 08/28/2019 0638   CREATININE 0.87 08/28/2019 0638   CALCIUM 8.5 (L) 08/28/2019 0638   PROT 5.6 (L) 08/28/2019 0638   ALBUMIN 2.5 (L) 08/28/2019 0638   AST 33 08/28/2019 0638   ALT 17 08/28/2019 0638   ALKPHOS 123 08/28/2019 0638   BILITOT 0.8 08/28/2019 0638   GFRNONAA >60 08/28/2019 0638   GFRAA >60 08/28/2019 0638   CBG (last 3)  Recent Labs    08/28/19 2215 08/29/19 0856 08/29/19 1209  GLUCAP 136* 88 88    Blood type: --/--/A POS, A POS Performed at [facility] [address], [place], [place] [address]  [phone]) Rubella: Immune (05/08 0000)                      I&O: Intake/Output      10/23 0701 - 10/24 0700 10/24 0701 - 10/25 0700   P.O. 2530    I.V. (mL/kg) 1936 (21.6)    Total Intake(mL/kg) 4466 (49.7)    Urine (mL/kg/hr) 8150 (3.8)    Emesis/NG output     Blood     Total Output 8150    Net -3684           Physical Exam: Alert and oriented X3 Lungs: Clear and unlabored Heart: regular rate and rhythm / no mumurs Abdomen: soft,  non-tender, non-distended  Fundus: firm, non-tender, below U Perineum: edematous, well-approximated Lochia: appropriate Extremities: generalized edema, no calf pain or tenderness    A/P:  PPD # 2      -stable status     -continue routine PP orders Pre-eclampsia     -hypertensive since MgSO4 was dc's, no severe range BP     -start Procardia XL 60 mg PO today and D/C home w/ same dose A2DM     -fasting and 2 hr PP WNL Anticipate D/C on 10/25   Plan reviewed w/ Dr. Jethro Bastos, MSN, CNM 08/29/2019, 1:38 PM

## 2019-08-29 NOTE — Progress Notes (Signed)
Discussed with patient the importance of keeping the baby on the double phototherapy during the night several times. Patient verbalized understanding, however, states baby does not like it and does not know how to feed and care for baby while on the phototherapy. She has been instructed on how and why importance is critical for baby.

## 2019-08-29 NOTE — Progress Notes (Signed)
CNM called & informed that pt does not have her SL secondary to pt insisting that it be removed despite Dr Mancel Bale request to keep it in & that she may need meds for HBP.  Orders rec'd to modify benadryl order to give IM.
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
## 2019-08-29 NOTE — Lactation Note (Signed)
This note was copied from a baby's chart. Lactation Consultation Note  Patient Name: Tara Nguyen UJNWM'G Date: 08/29/2019 Reason for consult: Follow-up assessment;1st time breastfeeding;Primapara;Early term 37-38.6wks;Hyperbilirubinemia;Maternal endocrine disorder;Infant weight loss Type of Endocrine Disorder?: Diabetes  68 hours old ETI female who is now being mostly formula fed by his mother, she's a P1. Spoke to [employer] prior entering the room and she informed LC that mom won't be putting baby to breast while at the hospital, she's just interested in pumping and bottle feeding.   Mom has brought her DEBP Medela from home and asked LC to show her how to use it. LC did and also asked her to take the kit from her hospital pump once she gets home so she can use it with her Medela pump at home. Mom has only pumped once today, explained to her the importance of consistent pumping.  Baby is mainly getting bottles of Gerber Gentle and is now on triple phototherapy. Reviewed breastmilk storage guidelines, supply and demand and pumping schedule. Encouraged to pump every 3 hours and to keep supplementing/feeding baby Gerber Gentle until she can pump volumes required for supplementation.   Mom started pumping with [facility] pump while on [facility] consultation, praised her for her efforts. She reported all questions and concerns were answered; she's aware of [name] and will call PRN.  Maternal Data    Feeding Feeding Type: Bottle Fed - Formula Nipple Type: Slow - flow  LATCH Score                   Interventions Interventions: Breast feeding basics reviewed;DEBP  Lactation Tools Discussed/Used Tools: Pump Breast pump type: Double-Electric Breast Pump Pump Review: Setup, frequency, and cleaning;Milk Storage Initiated by:: MPeck Date initiated:: 08/29/19   Consult Status Consult Status: PRN Follow-up type: In-patient    [name] 08/29/2019, 5:06  PM

## 2019-08-30 ENCOUNTER — Ambulatory Visit: Payer: Self-pay

## 2019-08-30 ENCOUNTER — Other Ambulatory Visit (HOSPITAL_COMMUNITY)
Admission: RE | Admit: 2019-08-30 | Discharge: 2019-08-30 | Disposition: A | Discharge status: Home / Self Care | Payer: Medicaid Other | Source: Ambulatory Visit

## 2019-08-30 DIAGNOSIS — O24419 Gestational diabetes mellitus in pregnancy, unspecified control: Secondary | ICD-10-CM | POA: Diagnosis present

## 2019-08-30 DIAGNOSIS — O1495 Unspecified pre-eclampsia, complicating the puerperium: Secondary | ICD-10-CM | POA: Diagnosis not present

## 2019-08-30 DIAGNOSIS — L509 Urticaria, unspecified: Secondary | ICD-10-CM | POA: Diagnosis not present

## 2019-08-30 MED ORDER — NIFEDIPINE ER 60 MG PO TB24: 60.0000 mg | ORAL_TABLET | Freq: Every day | ORAL | 0 refills | Status: DC

## 2019-08-30 NOTE — Progress Notes (Signed)
[facility] CNM to notify of pt's 2hrPP dinner Blood sugar of 174 and BP 134/94 asypmtomatic.  As per [facility] CNM d/c blood sugars at this time.

## 2019-08-30 NOTE — Lactation Note (Signed)
This note was copied from a baby's chart. Lactation Consultation Note  Patient Name: Tara Nguyen BMWUX'L Date: 08/30/2019 Reason for consult: Follow-up assessment;Primapara;Other (Comment)(pump and bottle feed emm onnly/even though mom reports she wants to breeastfeed too) P1.  Baby under bili lights.  Mom has bottle propped with small baby blanket when entered.   Discussed how to feed your baby and watched utube video with mom on paced bottle bottle feeding.  Urged mom to pump 8-12 times day to establish milk supply.  Urged mom not to prop bottles.  Urged mom to feed a few more mls of formula or breastmilk since infant had lost so much when feeding and still cuing.  Mom tried to feed a few more mls of formula.  Infant spitty with formula and it looks a little curdled.  Mom reports she thought it was coming from her breastmilk.  Urged mom to call lactation as needed  Maternal Data Formula Feeding for Exclusion: No Does the patient have breastfeeding experience prior to this delivery?: No  Feeding Feeding Type: Bottle Fed - Breast Milk(infant lights/mom has bottle propped/most has run) Nipple Type: Slow - flow  LATCH Score                   Interventions Interventions: DEBP  Lactation Tools Discussed/Used Pump Review: Setup, frequency, and cleaning   Consult Status Consult Status: Follow-up Date: 08/31/19 Follow-up type: In-patient    [facility] [place] 08/30/2019, 5:17 PM

## 2019-08-30 NOTE — Discharge Summary (Signed)
SVD OB Discharge Summary     Patient Name: Tara Nguyen DOB: 1996-10-12 MRN: [id number]  Date of admission: 08/27/2019 Delivering MD: Silverio LayIVARD, SANDRA  Date of delivery: 08/27/2019 Type of delivery: SVD  Newborn Data: Sex: Baby female Circumcision: out pt  Live born female  Birth Weight: 8 lb 2.7 oz (3705 g) APGAR: 4, 9  Newborn Delivery   Birth date/time: 08/27/2019 17:26:00 Delivery type: Vaginal, Spontaneous      Feeding: breast and bottle Infant being discharge to home with mother in stable condition.   Admitting diagnosis: 38WKS CTX, WATER BROKE Intrauterine pregnancy: 4163w2d     Secondary diagnosis:  Active Problems:   Normal labor   SVD (10/22)   Preeclampsia in postpartum period   Hives   Gestational diabetes   Shoulder dystocia during labor and delivery                                Complications: None                                                              Intrapartum Procedures: spontaneous vaginal delivery and episiotomy midline Postpartum Procedures: none Complications-Operative and Postpartum: postparutum preeclampsia with severe features tx with mag x24 hours.  Augmentation: None   History of Present Illness: Tara Nguyen is a 23 y.o. female, G2P1011, who presents at 1663w2d weeks gestation. [facility]  Her pregnancy has been complicated by:  Patient Active Problem List   Diagnosis Date Noted  . Preeclampsia in postpartum period 08/30/2019  . Hives 08/30/2019  . Gestational diabetes 08/30/2019  . Shoulder dystocia during labor and delivery 08/30/2019  . SVD (10/22) 08/28/2019  . Normal labor 08/27/2019    Hospital course:  Onset of Labor With Vaginal Delivery     23 y.o. yo G2P1011 at 5563w2d was admitted in Active Labor on 08/27/2019. Patient had an uncomplicated labor course as follows:  Membrane Rupture Time/Date: 5:00 AM ,08/27/2019   Intrapartum Procedures:  Episiotomy: Median [2]                                         Lacerations:  None [1]  Patient had a delivery of a Viable infant. 08/27/2019  Information for the patient's newborn:  Mable FillCarmichael, Boy Ashwika [[id number]][[id number]]  Delivery Method: Vag-Spont     Pateint had an uncomplicated postpartum course.  She is ambulating, tolerating a regular diet, passing flatus, and urinating well. Patient is discharged home in stable condition on 08/30/19.  Postpartum Day # 3 : S/P NSVD due to SROm in spontaneous labor. Patient up ad lib, denies syncope or dizziness. Reports consuming regular diet without issues and denies N/V. Patient reports 0 bowel movement + passing flatus.  Denies issues with urination and reports bleeding is "light."  Patient is breast and bottle feeding and reports going well.  Desires nothing but abstinence, despite education for postpartum contraception.  Pain is being appropriately managed with use of po meds. I discontinue pt motrin due to preE during PP period. Pt developed PP preE with SF with HA  and elevated SR BP, received 24 hours of PP mag, pt stable now, denies HA, ruq pain or vision changes, was placed on procardia no and increased to 60 mg xl daily, pt BP stable @ 129/91 currently. Pt stable. Currently was on cephalexin due to boil in buttock x4 weeks ago pt stated, no other reasons documented as to why she is on it now, I d/c keflex, pt also currently having hives, itchy,m she was given a cold rag, and PO benadryl. Pt has this from time to time when she gets rashes, has been prescribed triamcinolone cream without relief, denies itchy throat of SOB, pt stated cold rag helps. If rash continue I recommend seeing a dermatologist. Pt denies allergies to meds. Pt endorses having GDMA2 on insulin during pregnancy, has not required during PP period, pt will watch low carb diet, f/u PP.    Physical exam  Vitals:   08/29/19 1948 08/29/19 2330 08/30/19 0424 08/30/19 0907  BP: 125/72 (!) 134/94  109/69 (!) 129/91  Pulse: (!) 129 (!) 113 94 99  Resp: 18 18 18 12   Temp: 98.4 F (36.9 C) 98.4 F (36.9 C) 98.5 F (36.9 C) 99.2 F (37.3 C)  TempSrc: Oral Oral Oral Oral  SpO2:  100% 100% 100%  Weight:      Height:       General: alert, cooperative and no distress Lochia: appropriate Uterine Fundus: firm Perineum: approximate, no hematomas  DVT Evaluation: No evidence of DVT seen on physical exam. Negative Homan's sign. No cords or calf tenderness. No significant calf/ankle edema.  Labs: Lab Results  Component Value Date   WBC 16.5 (H) 08/28/2019   HGB 10.9 (L) 08/28/2019   HCT 32.8 (L) 08/28/2019   MCV 76.1 (L) 08/28/2019   PLT 250 08/28/2019   CMP Latest Ref Rng & Units 08/28/2019  Glucose 70 - 99 mg/dL 81  BUN 6 - 20 mg/dL 08/30/2019)  Creatinine <6(T - 1.00 mg/dL 0.35  Sodium 4.65 - 681 mmol/L 134(L)  Potassium 3.5 - 5.1 mmol/L 3.2(L)  Chloride 98 - 111 mmol/L 102  CO2 22 - 32 mmol/L 22  Calcium 8.9 - 10.3 mg/dL 275)  Total Protein 6.5 - 8.1 g/dL 1.7(G)  Total Bilirubin 0.3 - 1.2 mg/dL 0.8  Alkaline Phos 38 - 126 U/L 123  AST 15 - 41 U/L 33  ALT 0 - 44 U/L 17    Date of discharge: 08/30/2019 Discharge Diagnoses: Term Pregnancy-delivered, Preelampsia and with severe features Discharge instruction: per After Visit Summary and "Baby and Me Booklet".  After visit meds:   Activity:           unrestricted and pelvic rest Advance as tolerated. Pelvic rest for 6 weeks.  Diet:                routine Medications: PNV and tyelnol, benadryl, tramcinolone.  Postpartum contraception: Declined Condition:  Pt discharge to home with baby in stable condition  Later today, infant under billi lights now and is to be reassessed by peds. Baby may have to stay and if that occurs mother will room in.  Hives: D/c keflex in case of new allergy, gave benadryl, cool clothes, resolving, report to 09/01/2019  worsening s/sx, or SOB to ER.  Preeclampsia with severe features PP: Take procardia 60  XL daily, report s/sx of HA, ruq pain or vision changes, take bp call if >150/90s.  GDMA2: No insulin or medication at this time, f/u at 6 week ppv for 2hgtt to check  for overt diabetes.   Meds: Allergies as of 08/30/2019   No Known Allergies     Medication List    STOP taking these medications   cephALEXin 500 MG capsule Commonly known as: KEFLEX   cyclobenzaprine 10 MG tablet Commonly known as: FLEXERIL   insulin NPH Human 100 UNIT/ML injection Commonly known as: NOVOLIN N     TAKE these medications   NIFEdipine 60 MG 24 hr tablet Commonly known as: ADALAT CC Take 1 tablet (60 mg total) by mouth daily. Start taking on: August 31, 2019   prenatal multivitamin Tabs tablet Take 1 tablet by mouth daily at 12 noon.       Discharge Follow Up:  Follow-up [facility] Follow up.   Specialty: Obstetrics and Gynecology Why: Please call the office and make a 1 week BP check and 6 weeks PPV.  Contact information: [address] [place] [address] [place], NP-C, CNM 08/30/2019, 9:56 AM  Noralyn Pick, FNP

## 2019-08-31 ENCOUNTER — Ambulatory Visit: Payer: Self-pay

## 2019-08-31 NOTE — Lactation Note (Signed)
This note was copied from a baby's chart. Lactation Consultation Note  Patient Name: Tara Nguyen GYJEH'U Date: 08/31/2019 Reason for consult: 1st time breastfeeding;Follow-up assessment;Primapara;Early term 37-38.6wks;Infant weight loss;Other (Comment);Hyperbilirubinemia(6 % weight loss / off lights and rebound Bili at 1500/ encouraged to call for feeding assessment) Type of Endocrine Disorder?: Diabetes  Baby is 88 hours  Per mom had recently fed 38 ml of EBM she had pumped.  LC reviewed and updated the doc flow sheets per mom.  LC praised mom for her efforts pumping consistently and that her milk was in .  Mom denies soreness or engorgement .  LC explored options with mom for providing breast milk for her baby.  Mom undecided whether she will be latching or just pumping and bottle feeding.  If pumping - LC explained pumping 8-10 times a day around the clock both breast for 15 -20 mins.  Save milk and feed back to the baby. Storage of breast milk reviewed and on page 24 Mother and baby care booklet.  As of now the baby has been receiving bottles for feedings , EBM if available and  Formula is not.  2nd option is to work on latching the baby. LC recommended since the baby has had several bottles if the baby is  Hungry to feed the baby and appetizer of EBM with the bottle and then latch.  Mom as her DEBP - Medela in the room for D/C. LC reviewed set up and how to use her present  Tubings with this pump.   Mom aware she can call with feeding cues for feeding assessment if she desires.  And mom aware of the [facility] resources and phone numbers.     Maternal Data    Feeding Feeding Type: (per mom baby has last fed 0930) Nipple Type: Slow - flow  LATCH Score                   Interventions Interventions: Breast feeding basics reviewed  Lactation Tools Discussed/Used Tools: Pump;Shells;Flanges Flange Size: 24 Shell Type: Inverted Breast pump type: Double-Electric  Breast Pump;Manual Pump Review: Milk Storage;Setup, frequency, and cleaning Initiated by:: LC reviewed   Consult Status Consult Status: Follow-up Date: 08/31/19 Follow-up type: In-patient    [place] 08/31/2019, 10:15 AM

## 2019-09-01 ENCOUNTER — Inpatient Hospital Stay (HOSPITAL_COMMUNITY): Payer: Medicaid Other

## 2019-09-01 ENCOUNTER — Inpatient Hospital Stay (HOSPITAL_COMMUNITY)
Admission: AD | Admit: 2019-09-01 | Payer: Medicaid Other | Source: Home / Self Care | Admitting: Obstetrics and Gynecology

## 2019-11-09 ENCOUNTER — Ambulatory Visit: Payer: Medicaid Other | Admitting: Nurse Practitioner

## 2020-02-15 ENCOUNTER — Telehealth (INDEPENDENT_AMBULATORY_CARE_PROVIDER_SITE_OTHER): Payer: Medicaid Other | Admitting: Primary Care

## 2020-02-22 ENCOUNTER — Other Ambulatory Visit: Payer: Self-pay

## 2020-02-22 ENCOUNTER — Emergency Department (HOSPITAL_COMMUNITY)
Admission: EM | Admit: 2020-02-22 | Discharge: 2020-02-22 | Disposition: A | Discharge status: Home / Self Care | Payer: Medicaid Other | Attending: Emergency Medicine | Admitting: Emergency Medicine

## 2020-02-22 ENCOUNTER — Encounter (HOSPITAL_COMMUNITY): Payer: Self-pay

## 2020-02-22 DIAGNOSIS — Z331 Pregnant state, incidental: Secondary | ICD-10-CM | POA: Diagnosis not present

## 2020-02-22 DIAGNOSIS — R8271 Bacteriuria: Secondary | ICD-10-CM | POA: Diagnosis not present

## 2020-02-22 DIAGNOSIS — L0231 Cutaneous abscess of buttock: Secondary | ICD-10-CM

## 2020-02-22 DIAGNOSIS — Z3A01 Less than 8 weeks gestation of pregnancy: Secondary | ICD-10-CM | POA: Diagnosis not present

## 2020-02-22 DIAGNOSIS — L539 Erythematous condition, unspecified: Secondary | ICD-10-CM | POA: Diagnosis present

## 2020-02-22 LAB — URINALYSIS, ROUTINE W REFLEX MICROSCOPIC
Bilirubin Urine: NEGATIVE
Glucose, UA: NEGATIVE mg/dL
Hgb urine dipstick: NEGATIVE
Ketones, ur: NEGATIVE mg/dL
Nitrite: NEGATIVE
Protein, ur: NEGATIVE mg/dL
Specific Gravity, Urine: 1.027 (ref 1.005–1.030)
pH: 7 (ref 5.0–8.0)

## 2020-02-22 LAB — PREGNANCY, URINE: Preg Test, Ur: POSITIVE — AB

## 2020-02-22 LAB — HCG, QUANTITATIVE, PREGNANCY: hCG, Beta Chain, Quant, S: 86406 m[IU]/mL — ABNORMAL HIGH (ref <5)

## 2020-02-22 MED ORDER — LIDOCAINE HCL (PF) 1 % IJ SOLN
5.0000 mL | Freq: Once | INTRAMUSCULAR | Status: AC
  Administered 2020-02-22: 08:00:00 5 mL
  Filled 2020-02-22: qty 30

## 2020-02-22 MED ORDER — ACETAMINOPHEN 325 MG PO TABS
650.0000 mg | ORAL_TABLET | Freq: Once | ORAL | Status: AC
  Administered 2020-02-22: 650 mg via ORAL
  Filled 2020-02-22: qty 2

## 2020-02-22 MED ORDER — CEPHALEXIN 500 MG PO CAPS: 500.0000 mg | ORAL_CAPSULE | Freq: Two times a day (BID) | ORAL | 0 refills | Status: AC

## 2020-02-22 NOTE — ED Triage Notes (Signed)
Abscess between buttcheeks

## 2020-02-22 NOTE — ED Provider Notes (Signed)
[facility] DEPT Provider Note   CSN: [id number] Arrival date & time: 02/22/20  0938     History Chief Complaint  Patient presents with  . Abscess    Tara Nguyen is a 24 y.o. female who presents to the ED today complaining of an area of redness, swelling, and pain to left buttock x 1 week. Pt reports the area has grown in size, prompting her to come to the ED. She mentions similar symptoms years ago when she was pregnant and she is concerned she could be pregnant again. LNMP early March. Pt denies fevers, chills, drainage, pain with defecation, blood in stool, constipation, urinary sx, abdominal pain, nausea, vomiting, or any other associated symptoms.   The history is provided by the patient and medical records.       Past Medical History:  Diagnosis Date  . GDM (gestational diabetes mellitus) 07/2019    Patient Active Problem List   Diagnosis Date Noted  . Preeclampsia in postpartum period 08/30/2019  . Hives 08/30/2019  . Gestational diabetes 08/30/2019  . Shoulder dystocia during labor and delivery 08/30/2019  . SVD (10/22) 08/28/2019  . Normal labor 08/27/2019    Past Surgical History:  Procedure Laterality Date  . NO PAST SURGERIES       OB History    Gravida  2   Para  1   Term  1   Preterm      AB  1   Living  1     SAB  1   TAB      Ectopic      Multiple  0   Live Births  1           No family history on file.  Social History   Tobacco Use  . Smoking status: Never Smoker  . Smokeless tobacco: Never Used  Substance Use Topics  . Alcohol use: Never  . Drug use: Never    Home Medications Prior to Admission medications   Medication Sig Start Date End Date Taking? Authorizing Provider  cephALEXin (KEFLEX) 500 MG capsule Take 1 capsule (500 mg total) by mouth 2 (two) times daily for 7 days. 02/22/20 02/29/20  Eustaquio Maize, PA-C  NIFEdipine (ADALAT CC) 60 MG 24 hr tablet Take 1 tablet (60 mg  total) by mouth daily. 08/31/19   Noralyn Pick, [place]  Prenatal Vit-Fe Fumarate-FA (PRENATAL MULTIVITAMIN) TABS tablet Take 1 tablet by mouth daily at 12 noon.    [provider]    Allergies    Patient has no known allergies.  Review of Systems   Review of Systems  Constitutional: Negative for chills and fever.  Gastrointestinal: Negative for abdominal pain, constipation, diarrhea, nausea and vomiting.  Genitourinary: Positive for menstrual problem. Negative for difficulty urinating, dysuria and frequency.  Skin: Positive for color change (abscess to buttocks).    Physical Exam Updated Vital Signs BP 121/86 (BP Location: Left Arm)   Pulse 91   Temp 98.1 F (36.7 C) (Oral)   Resp 16   Ht 5' (1.524 m)   Wt 68 kg   SpO2 100%   BMI 29.29 kg/m   Physical Exam Vitals and nursing note reviewed.  Constitutional:      Appearance: She is not ill-appearing.  HENT:     Head: Normocephalic and atraumatic.  Eyes:     Conjunctiva/sclera: Conjunctivae normal.  Cardiovascular:     Rate and Rhythm: Normal rate and regular rhythm.     Pulses: Normal  pulses.  Pulmonary:     Effort: Pulmonary effort is normal.     Breath sounds: Normal breath sounds. No wheezing, rhonchi or rales.  Abdominal:     Tenderness: There is no abdominal tenderness. There is no guarding or rebound.  Genitourinary:    Comments: Chaperone present for rectal exam. 2 x 1 cm area of fluctuance to left buttock; does not cross the midline; no rectal involvement appreciated Skin:    General: Skin is warm and dry.     Coloration: Skin is not jaundiced.  Neurological:     Mental Status: She is alert.     ED Results / Procedures / Treatments   Labs (all labs ordered are listed, but only abnormal results are displayed) Labs Reviewed  PREGNANCY, URINE - Abnormal; Notable for the following components:      Result Value   Preg Test, Ur POSITIVE (*)    All other components within normal limits  URINALYSIS,  ROUTINE W REFLEX MICROSCOPIC - Abnormal; Notable for the following components:   APPearance HAZY (*)    Leukocytes,Ua LARGE (*)    Bacteria, UA RARE (*)    All other components within normal limits  HCG, QUANTITATIVE, PREGNANCY - Abnormal; Notable for the following components:   hCG, Beta Chain, Quant, S 86,406 (*)    All other components within normal limits  URINE CULTURE    EKG None  Radiology No results found.  Procedures .Marland KitchenIncision and Drainage  Date/Time: 02/22/2020 8:56 AM Performed by: Tanda Rockers, PA-C Authorized by: Tanda Rockers, PA-C   Consent:    Consent obtained:  Verbal   Consent given by:  Patient   Risks discussed:  Bleeding, incomplete drainage, infection and pain Location:    Type:  Abscess   Size:  2 x 1   Location:  Anogenital   Anogenital location:  Gluteal cleft Pre-procedure details:    Skin preparation:  Betadine Anesthesia (see MAR for exact dosages):    Anesthesia method:  Local infiltration   Local anesthetic:  Lidocaine 1% w/o epi Procedure type:    Complexity:  Complex Procedure details:    Needle aspiration: no     Incision types:  Stab incision   Incision depth:  Submucosal   Scalpel blade:  11   Wound management:  Probed and deloculated   Drainage:  Purulent   Drainage amount:  Moderate   Wound treatment:  Wound left open   Packing materials:  None Post-procedure details:    Patient tolerance of procedure:  Tolerated well, no immediate complications   (including critical care time)  Medications Ordered in ED Medications  lidocaine (PF) (XYLOCAINE) 1 % injection 5 mL (5 mLs Infiltration Given 02/22/20 0739)  acetaminophen (TYLENOL) tablet 650 mg (650 mg Oral Given 02/22/20 4174)    ED Course  I have reviewed the triage vital signs and the nursing notes.  Pertinent labs & imaging results that were available during my care of the patient were reviewed by me and considered in my medical decision making (see chart for  details).  Clinical Course as of Feb 22 1015  Mon Feb 22, 2020  0805 Preg Test, Ur(!): POSITIVE [MV]    Clinical Course User Index [MV] Tanda Rockers, PA-C   MDM Rules/Calculators/A&P                      24 year old female presents to the ED today complaining of abscess to her left buttock x1 week, gradually worsening.  On  arrival patient is afebrile, nontachycardic and nontachypneic.  She appears to be in no acute distress however reports pain with walking and sitting directly on her buttock.  She states that she had similar abscess a couple of years ago when she was pregnant and she is concerned she could be pregnant again.  Last normal menstrual period sometime in early March.  Will add on urine pregnancy test at this time.  Patient denies abdominal pain, pelvic pain, vaginal discharge, nausea, vomiting, urinary symptoms, any other additional symptoms.  No fevers or chills.  On exam patient does have 2 x 1 cm area of joints noted to her left buttock near midline however does not cross the midline.  Does not involve the rectum.  Will perform I&D at this time.   Incision and drainage performed, moderate amount of purulent drainage drained.  Wound left open.  Patient advised to follow-up with [facility] surgery for recheck given her current symptoms.   Urine preg positive. Will add on quant hcg at this time OB/GYN can follow however patient states that she is not interested in keeping this pregnancy.   hCG 86,406.  UA with large leuks, 21-50 red blood cells, 6-10 white blood cells with rare bacteria.  Given patient is pregnant today will cover with antibiotics for asymptomatic bacteriuria.   Discharge home at this time with instructions to follow-up with [facility] well [facility] to discuss pregnancy options.  SHe is advised to return to the ED for any worsening symptoms.  She is in agreement with plan and stable for discharge home.   This note  was prepared using Dragon voice recognition software and may include unintentional dictation errors due to the inherent limitations of voice recognition software.  Final Clinical Impression(s) / ED Diagnoses Final diagnoses:  Left buttock abscess  Less than [redacted] weeks gestation of pregnancy  Asymptomatic bacteriuria    Rx / DC Orders ED Discharge Orders         Ordered    cephALEXin (KEFLEX) 500 MG capsule  2 times daily     02/22/20 1015           Discharge Instructions     Please pick up antibiotics and take as prescribed. It appears you have some bacteria in your urine and given you are currently pregnant it is important to treat.  Follow up with [facility] Surgery given your recurrent buttock abscesses You may follow up with [facility] for [facility] regarding your positive pregnancy test.  Return to the ED for any worsening symptoms       Tanda Rockers, PA-C 02/22/20 1019    Jacalyn Lefevre, MD 02/22/20 1055

## 2020-02-22 NOTE — Discharge Instructions (Signed)
Please pick up antibiotics and take as prescribed. It appears you have some bacteria in your urine and given you are currently pregnant it is important to treat.  Follow up with [facility] given your recurrent buttock abscesses You may follow up with [facility] regarding your positive pregnancy test.  Return to the ED for any worsening symptoms

## 2020-02-23 LAB — URINE CULTURE

## 2020-02-24 ENCOUNTER — Telehealth: Payer: Self-pay | Admitting: Emergency Medicine

## 2020-02-24 NOTE — Telephone Encounter (Signed)
Post ED Visit - Positive Culture Follow-up  Culture report reviewed by antimicrobial stewardship pharmacist: [facility] []  , Pharm.D. []  Enzo Bi, Pharm.D., BCPS AQ-ID []  , Pharm.D., BCPS []  Celedonio Miyamoto, Pharm.D., BCPS []  [place], Garvin Fila.D., BCPS, AAHIVP []  , Pharm.D., BCPS, AAHIVP []  Georgina Pillion, PharmD, BCPS []  , PharmD, BCPS []  [place], PharmD, BCPS []  [address], PharmD []  , PharmD, BCPS []  Estella Husk, PharmD  Pharmacy Team []  Lysle Pearl, PharmD []  , PharmD [x]  Phillips Climes, PharmD []  , Rph []  Agapito Games) , PharmD []  Verlan Friends, PharmD []  , PharmD []  Mervyn Gay, PharmD []  , PharmD []  Vinnie Level, PharmD []  W[facility], PharmD []  , PharmD []  Len Childs, PharmD   Positive urine culture Treated with cephalexin, organism sensitive to the same and no further patient follow-up is required at this time.  02/24/2020, 1:18 PM

## 2020-07-26 ENCOUNTER — Other Ambulatory Visit: Payer: Medicaid Other

## 2020-07-26 ENCOUNTER — Other Ambulatory Visit: Payer: Self-pay

## 2020-07-26 DIAGNOSIS — Z20822 Contact with and (suspected) exposure to covid-19: Secondary | ICD-10-CM

## 2020-07-28 ENCOUNTER — Telehealth: Payer: Self-pay | Admitting: General Practice

## 2020-07-28 LAB — NOVEL CORONAVIRUS, NAA: SARS-CoV-2, NAA: NOT DETECTED

## 2020-07-28 LAB — SARS-COV-2, NAA 2 DAY TAT

## 2020-07-28 NOTE — Telephone Encounter (Signed)
Patient is calling to receive her negative COVID test results. Patient expressed understanding. 

## 2020-11-17 ENCOUNTER — Telehealth (HOSPITAL_COMMUNITY): Payer: Self-pay | Admitting: Professional

## 2020-11-24 ENCOUNTER — Other Ambulatory Visit: Payer: Medicaid Other

## 2020-11-24 DIAGNOSIS — Z20822 Contact with and (suspected) exposure to covid-19: Secondary | ICD-10-CM

## 2020-11-26 LAB — SARS-COV-2, NAA 2 DAY TAT

## 2020-11-26 LAB — NOVEL CORONAVIRUS, NAA: SARS-CoV-2, NAA: NOT DETECTED

## 2020-11-29 ENCOUNTER — Telehealth (HOSPITAL_COMMUNITY): Payer: Self-pay | Admitting: Clinical

## 2020-11-29 ENCOUNTER — Other Ambulatory Visit: Payer: Self-pay

## 2020-11-29 ENCOUNTER — Ambulatory Visit (HOSPITAL_COMMUNITY): Payer: Medicaid Other | Admitting: Clinical

## 2020-11-29 NOTE — Telephone Encounter (Signed)
Therapist was able to make contact with the client via MyChart video for the scheduled appointment. Client reported she was at work and unable to keep the appointment. Therapist informed the client of walk in hours to have an assessment completed. Client was agreeable to presenting as a walk in. Client reported no crisis needs at this time.

## 2020-12-13 ENCOUNTER — Ambulatory Visit (HOSPITAL_COMMUNITY): Payer: Medicaid Other | Admitting: Professional

## 2020-12-19 ENCOUNTER — Telehealth (HOSPITAL_COMMUNITY): Payer: Self-pay | Admitting: Professional

## 2020-12-19 ENCOUNTER — Ambulatory Visit (HOSPITAL_COMMUNITY): Payer: Medicaid Other | Admitting: Professional

## 2020-12-19 ENCOUNTER — Other Ambulatory Visit: Payer: Self-pay

## 2020-12-19 NOTE — Telephone Encounter (Signed)
Cln called back. Cln able to hear pt this time. Pt reports she was at [facility] picking up her meds and would be home in a few minutes. Pt reports she was not comfortable talking in the car due to someone else driving. Cln told pt apt would need to be rescheduled due to appointment not being started until 20+ minutes after appointment was scheduled. Pt reports she didn't get the email notification on her phone; email for appointment was sent at 358. Cln apologizes that notification was heard on phone and explains that email was sent prior to 4p. Pt was mad due to being rescheduled from last week due to this cln being sick. Cln apologized for that. Pt again said she could do the appointment as soon as she got home and cln explained again that the CCA would take the full hour. Pt reports "Y'all aint trying to help me and I just need some help. What if I was suicidal?" This cln asked if pt is having SI/HI/AVH. Pt denied and reports "I just need someone to talk to." Cln told pt she would need to go to [facility] or the ED if she was suicidal or call 911. Pt again reports she's not. Pt reports "this is the 4th time yall are going to reschedule." Cln explains that the first time it appears the pt was unable to complete the assessment due to being at work, the second was for this cln being sick (and this cln apologized again),and this time was for the pt not being in an appropriate place to completed the CCA at the time of the appointment. Pt agreed to be rescheduled. Cln offered appointment tomorrow at 11a; pt declined due to having a doctors appointment at 1030. Cln offered appointment on Thursday at 4p. Pt declined and said she wanted something sooner. 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 Pt declined. Pt again accused this cln and office of not  wanting to help. Cln again explained that is not the case and a full hour would be needed to complete the appointment and pt would need to be in a location to complete that appointment on time. Pt reports wanting to be rescheduled but with someone that wants to help. Cln told pt she would contact front desk to inform them that pt wants an appointment quickly but declined this clns soonest appointments. Cln called Lyric at front desk and explained the situation. Lyric will call pt and have her scheduled with another provider.

## 2020-12-27 ENCOUNTER — Encounter (HOSPITAL_COMMUNITY): Payer: Self-pay | Admitting: Emergency Medicine

## 2020-12-27 ENCOUNTER — Other Ambulatory Visit: Payer: Self-pay

## 2020-12-27 ENCOUNTER — Inpatient Hospital Stay (HOSPITAL_COMMUNITY): Payer: Medicaid Other

## 2020-12-27 ENCOUNTER — Inpatient Hospital Stay (HOSPITAL_COMMUNITY)
Admission: AD | Admit: 2020-12-27 | Discharge: 2020-12-27 | Disposition: A | Discharge status: Home / Self Care | Payer: Medicaid Other | Attending: Emergency Medicine | Admitting: Emergency Medicine

## 2020-12-27 DIAGNOSIS — O3680X0 Pregnancy with inconclusive fetal viability, not applicable or unspecified: Secondary | ICD-10-CM

## 2020-12-27 DIAGNOSIS — Z3A08 8 weeks gestation of pregnancy: Secondary | ICD-10-CM | POA: Insufficient documentation

## 2020-12-27 DIAGNOSIS — O208 Other hemorrhage in early pregnancy: Secondary | ICD-10-CM | POA: Diagnosis not present

## 2020-12-27 DIAGNOSIS — O209 Hemorrhage in early pregnancy, unspecified: Secondary | ICD-10-CM | POA: Insufficient documentation

## 2020-12-27 HISTORY — DX: Personal history of other infectious and parasitic diseases: Z86.19

## 2020-12-27 LAB — URINALYSIS, ROUTINE W REFLEX MICROSCOPIC
Bilirubin Urine: NEGATIVE
Glucose, UA: NEGATIVE mg/dL
Ketones, ur: NEGATIVE mg/dL
Nitrite: NEGATIVE
Protein, ur: 100 mg/dL — AB
RBC / HPF: >50 RBC/hpf — ABNORMAL HIGH (ref 0–5)
RBC / HPF: >50 RBC/hpf — ABNORMAL HIGH (ref 0–5)
Specific Gravity, Urine: 1.03 (ref 1.005–1.030)
pH: 5 (ref 5.0–8.0)

## 2020-12-27 LAB — CBC
HCT: 35.8 % — ABNORMAL LOW (ref 36.0–46.0)
Hemoglobin: 11.5 g/dL — ABNORMAL LOW (ref 12.0–15.0)
MCH: 25.2 pg — ABNORMAL LOW (ref 26.0–34.0)
MCHC: 32.1 g/dL (ref 30.0–36.0)
MCV: 78.5 fL — ABNORMAL LOW (ref 80.0–100.0)
Platelets: 337 10*3/uL (ref 150–400)
RBC: 4.56 MIL/uL (ref 3.87–5.11)
RDW: 17.2 % — ABNORMAL HIGH (ref 11.5–15.5)
WBC: 7.2 10*3/uL (ref 4.0–10.5)
nRBC: 0 % (ref 0.0–0.2)

## 2020-12-27 LAB — I-STAT BETA HCG BLOOD, ED (MC, WL, AP ONLY): I-stat hCG, quantitative: 760.4 m[IU]/mL — ABNORMAL HIGH (ref <5)

## 2020-12-27 LAB — COMPREHENSIVE METABOLIC PANEL
ALT: 8 U/L (ref 0–44)
AST: 12 U/L — ABNORMAL LOW (ref 15–41)
Albumin: 3.9 g/dL (ref 3.5–5.0)
Alkaline Phosphatase: 49 U/L (ref 38–126)
Anion gap: 10 (ref 5–15)
BUN: 11 mg/dL (ref 6–20)
CO2: 24 mmol/L (ref 22–32)
Calcium: 9.1 mg/dL (ref 8.9–10.3)
Chloride: 107 mmol/L (ref 98–111)
Creatinine, Ser: 0.74 mg/dL (ref 0.44–1.00)
GFR, Estimated: >60 mL/min (ref >60)
Glucose, Bld: 102 mg/dL — ABNORMAL HIGH (ref 70–99)
Potassium: 3.7 mmol/L (ref 3.5–5.1)
Sodium: 141 mmol/L (ref 135–145)
Total Bilirubin: 0.6 mg/dL (ref 0.3–1.2)
Total Protein: 6.6 g/dL (ref 6.5–8.1)

## 2020-12-27 LAB — WET PREP, GENITAL
Clue Cells Wet Prep HPF POC: NONE SEEN
Sperm: NONE SEEN
Trich, Wet Prep: NONE SEEN
Yeast Wet Prep HPF POC: NONE SEEN

## 2020-12-27 LAB — LIPASE, BLOOD: Lipase: 30 U/L (ref 11–51)

## 2020-12-27 LAB — HCG, QUANTITATIVE, PREGNANCY: hCG, Beta Chain, Quant, S: 969 m[IU]/mL — ABNORMAL HIGH (ref <5)

## 2020-12-27 NOTE — MAU Provider Note (Signed)
History     CSN: [id number]  Arrival date and time: 12/27/20 1347   Event Date/Time   First Provider Initiated Contact with Patient 12/27/20 1657      Chief Complaint  Patient presents with  . Vaginal Bleeding   HPI Tara Nguyen is a 25 y.o. [id number] at [redacted]w[redacted]d who presents with vaginal bleeding. Reports some brown spotting last week; went to [facility] & had ultrasound that showed a gestational sac. She states today was heavier red bleeding with some small clots. Has some low back pain & abdominal cramping. Rates pain 9/10. Hasn't treated symptoms. Nothing makes better or worse.   OB History    Gravida  4   Para  1   Term  1   Preterm      AB  2   Living  1     SAB  1   IAB  1   Ectopic      Multiple  0   Live Births  1           Past Medical History:  Diagnosis Date  . GDM (gestational diabetes mellitus) 07/2019  . Hx of preeclampsia, prior pregnancy, currently pregnant 2020  . Hx of trichomoniasis     Past Surgical History:  Procedure Laterality Date  . NO PAST SURGERIES      No family history on file.  Social History   Tobacco Use  . Smoking status: Never Smoker  . Smokeless tobacco: Never Used  Substance Use Topics  . Alcohol use: Never  . Drug use: Never    Allergies: No Known Allergies  Medications Prior to Admission  Medication Sig Dispense Refill Last Dose  . NIFEdipine (ADALAT CC) 60 MG 24 hr tablet Take 1 tablet (60 mg total) by mouth daily. 30 tablet 0   . Prenatal Vit-Fe Fumarate-FA (PRENATAL MULTIVITAMIN) TABS tablet Take 1 tablet by mouth daily at 12 noon.   Unknown at Unknown time    Review of Systems  Constitutional: Negative.   Gastrointestinal: Positive for abdominal pain.  Genitourinary: Positive for vaginal bleeding.  Musculoskeletal: Positive for back pain.   Physical Exam   Blood pressure 104/73, pulse 79, temperature 98.8 F (37.1 C), temperature source Oral, resp. rate 18, last menstrual period 10/31/2020,  SpO2 100 %, unknown if currently breastfeeding.  Physical Exam Vitals and nursing note reviewed. Exam conducted with a chaperone present.  Constitutional:      General: She is not in acute distress.    Appearance: Normal appearance. She is normal weight.  HENT:     Head: Normocephalic and atraumatic.  Pulmonary:     Effort: Pulmonary effort is normal. No respiratory distress.  Abdominal:     General: Abdomen is flat.     Tenderness: There is no abdominal tenderness.  Genitourinary:    General: Normal vulva.     Exam position: Lithotomy position.     Vagina: Bleeding present.     Comments: Small amount of dark mucoid blood. Cervix closed.  Skin:    General: Skin is warm and dry.  Neurological:     Mental Status: She is alert.  Psychiatric:        Mood and Affect: Mood normal.        Behavior: Behavior normal.     MAU Course  Procedures Results for orders placed or performed during the hospital encounter of 12/27/20 (from the past 24 hour(s))  Lipase, blood     Status: None   Collection Time: 12/27/20  2:16 PM  Result Value Ref Range   Lipase 30 11 - 51 U/L  Comprehensive metabolic panel     Status: Abnormal   Collection Time: 12/27/20  2:16 PM  Result Value Ref Range   Sodium 141 135 - 145 mmol/L   Potassium 3.7 3.5 - 5.1 mmol/L   Chloride 107 98 - 111 mmol/L   CO2 24 22 - 32 mmol/L   Glucose, Bld 102 (H) 70 - 99 mg/dL   BUN 11 6 - 20 mg/dL   Creatinine, Ser 8.34 0.44 - 1.00 mg/dL   Calcium 9.1 8.9 - 19.6 mg/dL   Total Protein 6.6 6.5 - 8.1 g/dL   Albumin 3.9 3.5 - 5.0 g/dL   AST 12 (L) 15 - 41 U/L   ALT 8 0 - 44 U/L   Alkaline Phosphatase 49 38 - 126 U/L   Total Bilirubin 0.6 0.3 - 1.2 mg/dL   GFR, Estimated >22 >29 mL/min   Anion gap 10 5 - 15  CBC     Status: Abnormal   Collection Time: 12/27/20  2:16 PM  Result Value Ref Range   WBC 7.2 4.0 - 10.5 K/uL   RBC 4.56 3.87 - 5.11 MIL/uL   Hemoglobin 11.5 (L) 12.0 - 15.0 g/dL   HCT 79.8 (L) 92.1 - 19.4 %    MCV 78.5 (L) 80.0 - 100.0 fL   MCH 25.2 (L) 26.0 - 34.0 pg   MCHC 32.1 30.0 - 36.0 g/dL   RDW 17.4 (H) 08.1 - 44.8 %   Platelets 337 150 - 400 K/uL   nRBC 0.0 0.0 - 0.2 %  I-Stat beta hCG blood, ED     Status: Abnormal   Collection Time: 12/27/20  2:38 PM  Result Value Ref Range   I-stat hCG, quantitative 760.4 (H) <5 mIU/mL   Comment 3          hCG, quantitative, pregnancy     Status: Abnormal   Collection Time: 12/27/20  2:39 PM  Result Value Ref Range   hCG, Beta Chain, Quant, S 969 (H) <5 mIU/mL  Urinalysis, Routine w reflex microscopic     Status: Abnormal   Collection Time: 12/27/20  4:46 PM  Result Value Ref Range   Color, Urine AMBER (A) YELLOW   APPearance HAZY (A) CLEAR   Specific Gravity, Urine 1.030 1.005 - 1.030   pH 5.0 5.0 - 8.0   Glucose, UA NEGATIVE NEGATIVE mg/dL   Hgb urine dipstick LARGE (A) NEGATIVE   Bilirubin Urine NEGATIVE NEGATIVE   Ketones, ur NEGATIVE NEGATIVE mg/dL   Protein, ur 185 (A) NEGATIVE mg/dL   Nitrite NEGATIVE NEGATIVE   Leukocytes,Ua SMALL (A) NEGATIVE   RBC / HPF >50 (H) 0 - 5 RBC/hpf   WBC, UA 21-50 0 - 5 WBC/hpf   Bacteria, UA MANY (A) NONE SEEN   Squamous Epithelial / LPF 6-10 0 - 5   Mucus PRESENT   Urinalysis, Routine w reflex microscopic Urine, Clean Catch     Status: Abnormal   Collection Time: 12/27/20  5:10 PM  Result Value Ref Range   Color, Urine (A) YELLOW    QUESTIONABLE RESULTS, RECOMMEND RECOLLECT TO VERIFY   APPearance (A) CLEAR    QUESTIONABLE RESULTS, RECOMMEND RECOLLECT TO VERIFY   Specific Gravity, Urine  1.005 - 1.030    QUESTIONABLE RESULTS, RECOMMEND RECOLLECT TO VERIFY   pH  5.0 - 8.0    QUESTIONABLE RESULTS, RECOMMEND RECOLLECT TO VERIFY   Glucose, UA (A) NEGATIVE  mg/dL    QUESTIONABLE RESULTS, RECOMMEND RECOLLECT TO VERIFY   Hgb urine dipstick (A) NEGATIVE    QUESTIONABLE RESULTS, RECOMMEND RECOLLECT TO VERIFY   Bilirubin Urine (A) NEGATIVE    QUESTIONABLE RESULTS, RECOMMEND RECOLLECT TO VERIFY    Ketones, ur (A) NEGATIVE mg/dL    QUESTIONABLE RESULTS, RECOMMEND RECOLLECT TO VERIFY   Protein, ur (A) NEGATIVE mg/dL    QUESTIONABLE RESULTS, RECOMMEND RECOLLECT TO VERIFY   Nitrite (A) NEGATIVE    QUESTIONABLE RESULTS, RECOMMEND RECOLLECT TO VERIFY   Leukocytes,Ua (A) NEGATIVE    QUESTIONABLE RESULTS, RECOMMEND RECOLLECT TO VERIFY   RBC / HPF >50 (H) 0 - 5 RBC/hpf   WBC, UA 21-50 0 - 5 WBC/hpf   Bacteria, UA MANY (A) NONE SEEN   Squamous Epithelial / LPF 6-10 0 - 5   Mucus PRESENT    Budding Yeast PRESENT   Wet prep, genital     Status: Abnormal   Collection Time: 12/27/20  5:10 PM  Result Value Ref Range   Yeast Wet Prep HPF POC NONE SEEN NONE SEEN   Trich, Wet Prep NONE SEEN NONE SEEN   Clue Cells Wet Prep HPF POC NONE SEEN NONE SEEN   WBC, Wet Prep HPF POC MANY (A) NONE SEEN   Sperm NONE SEEN    US OB LESS THAN 14 WEEKS WITH OB TRANSVAGINAL  Result Date: 12/27/2020 CLINICAL DATA:  Vaginal bleeding, lower back and abdominal pain. Quantitative beta hCG of 760. Gestational age by last menstrual period is 8 weeks and 1 day. EXAM: OBSTETRIC <14 WK [place] AND TRANSVAGINAL OB US TECHNIQUE: Both transabdominal and transvaginal ultrasound examinations were performed for complete evaluation of the gestation as well as the maternal uterus, adnexal regions, and pelvic cul-de-sac. Transvaginal technique was performed to assess early pregnancy. COMPARISON:  None. FINDINGS: Intrauterine gestational sac: None Yolk sac:  Not Visualized. Embryo:  Not Visualized. Cardiac Activity: Not Visualized. Maternal uterus/adnexae: A small 9 mm cyst is seen in the right ovary, likely a corpus luteal cyst. The left ovary appears normal. No free intraperitoneal fluid. IMPRESSION: No intrauterine gestational sac is identified. Findings are suspicious but not yet definitive for failed pregnancy. Recommend follow-up US in 10-14 days for definitive diagnosis. This recommendation follows SRU consensus guidelines: Diagnostic  Criteria for Nonviable Pregnancy Early in the First Trimester. Malva Limes Med 2013; 086:5784-69. These results were called by telephone at the time of interpretation on 12/27/2020 at 6:51 pm to provider Judeth Horn , who verbally acknowledged these results. Electronically Signed   By: Romona Curls M.D.   On: 12/27/2020 18:51    MDM +UPT UA, wet prep, GC/chlamydia, CBC, ABO/Rh, quant hCG, and [place] today to rule out ectopic pregnancy which can be life threatening.   RH positive  HCG today is 969 No IUP or adnexal mass seen on ultrasound today.  S/w [place] ([facility] CNM) - reports ultrasound on 2/15 that showed IUGS & ?yolk sac. Will bring patient to office on Friday for repeat HCG. Per CNM, pt should call [facility] tomorrow to schedule lab appointment.   Assessment and Plan   1. Pregnancy of unknown anatomic location   2. [redacted] weeks gestation of pregnancy   3. Vaginal bleeding in pregnancy, first trimester    -reviewed ectopic vs SAB precautions -pelvic rest -call office tomorrow for lab appointment  Judeth Horn 12/27/2020, 7:08 PM

## 2020-12-27 NOTE — ED Notes (Signed)
Attempted report to MAU.

## 2020-12-27 NOTE — ED Triage Notes (Signed)
Pt states she is [redacted] weeks pregnant and having vaginal bleeding, sharp lower back pain, and lower abd pain.

## 2020-12-27 NOTE — ED Provider Notes (Signed)
Emergency Medicine Provider OB Triage Evaluation Note  Tara Nguyen is a 25 y.o. female, G2P1011, at Unknown gestation who presents to the emergency department with complaints of vaginal bleeding.  Patient report having intermittent vaginal bleeding ongoing for at least 5 days with now increasing abdominal cramping and back pain.  She is approximately [redacted] weeks pregnant.  Review of  Systems  Positive: vaginal bleed Negative: fever  Physical Exam  BP 115/62   Pulse 88   Temp 98.8 F (37.1 C) (Oral)   Resp 15   SpO2 100%  General: Awake, no distress  HEENT: Atraumatic  Resp: Normal effort  Cardiac: Normal rate Abd: Nondistended, nontender  MSK: Moves all extremities without difficulty Neuro: Speech clear  Medical Decision Making  Pt evaluated for pregnancy concern and is stable for transfer to MAU. Pt is in agreement with plan for transfer.  3:20 PM Discussed with MAU APP, Denny Peon, who accepts patient in transfer.  Clinical Impression  No diagnosis found.     Fayrene Helper, PA-C 12/27/20 1522    Melene Plan, DO 12/27/20 1600

## 2020-12-27 NOTE — ED Notes (Signed)
Report called to Nicole in MAU 

## 2020-12-27 NOTE — ED Notes (Signed)
PA at triage for MSE 

## 2020-12-27 NOTE — Discharge Instructions (Signed)
Return to care  If you have heavier bleeding that soaks through more that 2 pads per hour for an hour or more If you bleed so much that you feel like you might pass out or you do pass out If you have significant abdominal pain that is not improved with Tylenol   

## 2020-12-27 NOTE — MAU Note (Signed)
Pt transferred from [facility] with early pregnant vaginal bleeding and back and lower abdominal bleeding. She states she had some spotting on 2/10. Saw her OB on 2/12 and was told the bleeding was old blood , and advised to seek care if bleeding increased. Pt has had heavier bleeding today with clots and increased pain. Denies fever or nausea.

## 2020-12-28 LAB — GC/CHLAMYDIA PROBE AMP (~~LOC~~) NOT AT ARMC
Chlamydia: NEGATIVE
Comment: NEGATIVE
Comment: NORMAL
Neisseria Gonorrhea: NEGATIVE

## 2021-11-08 ENCOUNTER — Ambulatory Visit (HOSPITAL_COMMUNITY)
Admission: EM | Admit: 2021-11-08 | Discharge: 2021-11-08 | Disposition: A | Discharge status: Home / Self Care | Payer: Medicaid Other | Attending: Internal Medicine | Admitting: Internal Medicine

## 2021-11-08 ENCOUNTER — Other Ambulatory Visit: Payer: Self-pay

## 2021-11-08 ENCOUNTER — Encounter (HOSPITAL_COMMUNITY): Payer: Self-pay

## 2021-11-08 DIAGNOSIS — L508 Other urticaria: Secondary | ICD-10-CM | POA: Diagnosis not present

## 2021-11-08 DIAGNOSIS — N912 Amenorrhea, unspecified: Secondary | ICD-10-CM

## 2021-11-08 LAB — POC URINE PREG, ED: Preg Test, Ur: NEGATIVE

## 2021-11-08 NOTE — ED Provider Notes (Signed)
[facility]    CSN: [id number] Arrival date & time: 11/08/21  0815      History   Chief Complaint Chief Complaint  Patient presents with   Rash    HPI Tara Nguyen is a 26 y.o. female.   Chronic Urticaria Reports over a year of off and on hives No difficulty breathing or throat swelling Prior history of chronic urticaria seen by dermatology on 7/28 and prescribed Xyzal and Claritin States that she didn't go back because it didn't help Presents today because she has been scratching a lot and wants a fix to her problem  Requests pregnancy test today Reports that breasts have been sore Patient's last menstrual period was 10/11/2021, reports was heavier than normal No abdominal pain or vaginal bleeding Breasts have been leaking some milk Has an appointment with her OB on 1/6   Past Medical History:  Diagnosis Date   GDM (gestational diabetes mellitus) 07/2019   Hx of preeclampsia, prior pregnancy, currently pregnant 2020   Hx of trichomoniasis     Patient Active Problem List   Diagnosis Date Noted   Preeclampsia in postpartum period 08/30/2019   Hives 08/30/2019   Gestational diabetes 08/30/2019   Shoulder dystocia during labor and delivery 08/30/2019   SVD (10/22) 08/28/2019   Normal labor 08/27/2019    Past Surgical History:  Procedure Laterality Date   NO PAST SURGERIES      OB History     Gravida  4   Para  1   Term  1   Preterm      AB  2   Living  1      SAB  1   IAB  1   Ectopic      Multiple  0   Live Births  1            Home Medications    Prior to Admission medications   Not on File    Family History History reviewed. No pertinent family history.  Social History Social History   Tobacco Use   Smoking status: Never   Smokeless tobacco: Never  Substance Use Topics   Alcohol use: Never   Drug use: Never     Allergies   Patient has no known allergies.   Review of Systems Review of  Systems  All other systems reviewed and are negative. Per HPI  Physical Exam Triage Vital Signs ED Triage Vitals  Enc Vitals Group     BP      Pulse      Resp      Temp      Temp src      SpO2      Weight      Height      Head Circumference      Peak Flow      Pain Score      Pain Loc      Pain Edu?      Excl. in GC?    No data found.  Updated Vital Signs BP 132/82 (BP Location: Left Arm)    Pulse 86    Temp 98.6 F (37 C) (Oral)    Resp 18    LMP 10/11/2021    SpO2 99%    Breastfeeding No   Visual Acuity Right Eye Distance:   Left Eye Distance:   Bilateral Distance:    Right Eye Near:   Left Eye Near:    Bilateral Near:  Physical Exam Constitutional:      General: She is not in acute distress.    Appearance: Normal appearance. She is not ill-appearing.  HENT:     Head: Normocephalic and atraumatic.     Mouth/Throat:     Pharynx: Oropharynx is clear.     Comments: Patent airway Eyes:     Conjunctiva/sclera: Conjunctivae normal.  Pulmonary:     Effort: Pulmonary effort is normal.  Skin:    General: Skin is warm and dry.     Comments: Diffuse scattered erythematous wheals  Neurological:     General: No focal deficit present.     Mental Status: She is alert and oriented to person, place, and time.     UC Treatments / Results  Labs (all labs ordered are listed, but only abnormal results are displayed) Labs Reviewed  POC URINE PREG, ED    EKG   Radiology No results found.  Procedures Procedures (including critical care time)  Medications Ordered in UC Medications - No data to display  Initial Impression / Assessment and Plan / UC Course  I have reviewed the triage vital signs and the nursing notes.  Pertinent labs & imaging results that were available during my care of the patient were reviewed by me and considered in my medical decision making (see chart for details).     Acute on chronic urticaria.  Recommended benadryl, calamine  lotion and oatmeal baths.  Recommended ultimately patient needs to follow-up with dermatology to continue to work on treatment for her chronic urticaria.  Negative pregnancy test today and no indication for emergent referral.  She does have an appointment with her OB/GYN in 2 days, recommend follow-up at that time for her galactorrhea.  Did discuss with her that a small amount could be related to getting ready to start her menstrual period, but if continues further work-up may be indicated which can be completed by her OB/GYN.   Final Clinical Impressions(s) / UC Diagnoses   Final diagnoses:  Chronic urticaria  Amenorrhea     Discharge Instructions      As we discussed, you can use Benadryl for your rash and itching.  If you would like to use something topical, topical Benadryl, calamine lotion, oatmeal baths may be helpful.  Ultimately, you need to be seen by your dermatologist again to continue to work on this treatment.  If you experience difficulty breathing, he should go to the emergency room right away.  Follow-up with your OB/GYN in 2 days for your other concerns.     ED Prescriptions   None    PDMP not reviewed this encounter.   Delberta Folts, Solmon Ice, DO 11/08/21 1021

## 2021-11-08 NOTE — Discharge Instructions (Signed)
As we discussed, you can use Benadryl for your rash and itching.  If you would like to use something topical, topical Benadryl, calamine lotion, oatmeal baths may be helpful.  Ultimately, you need to be seen by your dermatologist again to continue to work on this treatment.  If you experience difficulty breathing, he should go to the emergency room right away.  Follow-up with your OB/GYN in 2 days for your other concerns.

## 2021-11-08 NOTE — ED Triage Notes (Signed)
Pt c/o itchy burning rash all over for year, worse when sleeping. States saw a dermatologist once and given medication that didn't work.   Pt requesting pregnancy test d/t sore and leaking nipples. [facility] 10/11/21

## 2022-02-07 ENCOUNTER — Encounter (HOSPITAL_COMMUNITY): Payer: Self-pay | Admitting: Family Medicine

## 2022-02-07 ENCOUNTER — Inpatient Hospital Stay (HOSPITAL_COMMUNITY): Payer: Medicaid Other

## 2022-02-07 ENCOUNTER — Inpatient Hospital Stay (HOSPITAL_COMMUNITY)
Admission: AD | Admit: 2022-02-07 | Discharge: 2022-02-07 | Disposition: A | Discharge status: Home / Self Care | Payer: Medicaid Other | Attending: Family Medicine | Admitting: Family Medicine

## 2022-02-07 ENCOUNTER — Other Ambulatory Visit: Payer: Self-pay

## 2022-02-07 DIAGNOSIS — O219 Vomiting of pregnancy, unspecified: Secondary | ICD-10-CM | POA: Diagnosis present

## 2022-02-07 DIAGNOSIS — O99281 Endocrine, nutritional and metabolic diseases complicating pregnancy, first trimester: Secondary | ICD-10-CM | POA: Insufficient documentation

## 2022-02-07 DIAGNOSIS — O99321 Drug use complicating pregnancy, first trimester: Secondary | ICD-10-CM | POA: Insufficient documentation

## 2022-02-07 DIAGNOSIS — R112 Nausea with vomiting, unspecified: Secondary | ICD-10-CM | POA: Diagnosis not present

## 2022-02-07 DIAGNOSIS — R109 Unspecified abdominal pain: Secondary | ICD-10-CM | POA: Insufficient documentation

## 2022-02-07 DIAGNOSIS — Z8632 Personal history of gestational diabetes: Secondary | ICD-10-CM | POA: Diagnosis not present

## 2022-02-07 DIAGNOSIS — E860 Dehydration: Secondary | ICD-10-CM | POA: Insufficient documentation

## 2022-02-07 DIAGNOSIS — O09891 Supervision of other high risk pregnancies, first trimester: Secondary | ICD-10-CM | POA: Insufficient documentation

## 2022-02-07 DIAGNOSIS — O26891 Other specified pregnancy related conditions, first trimester: Secondary | ICD-10-CM | POA: Insufficient documentation

## 2022-02-07 DIAGNOSIS — Z3A01 Less than 8 weeks gestation of pregnancy: Secondary | ICD-10-CM | POA: Diagnosis not present

## 2022-02-07 DIAGNOSIS — F1290 Cannabis use, unspecified, uncomplicated: Secondary | ICD-10-CM | POA: Diagnosis not present

## 2022-02-07 DIAGNOSIS — O26899 Other specified pregnancy related conditions, unspecified trimester: Secondary | ICD-10-CM

## 2022-02-07 DIAGNOSIS — E8729 Other acidosis: Secondary | ICD-10-CM | POA: Diagnosis not present

## 2022-02-07 LAB — COMPREHENSIVE METABOLIC PANEL
ALT: 17 U/L (ref 0–44)
AST: 17 U/L (ref 15–41)
Albumin: 4.6 g/dL (ref 3.5–5.0)
Alkaline Phosphatase: 66 U/L (ref 38–126)
Anion gap: 17 — ABNORMAL HIGH (ref 5–15)
BUN: 10 mg/dL (ref 6–20)
CO2: 19 mmol/L — ABNORMAL LOW (ref 22–32)
Calcium: 10.1 mg/dL (ref 8.9–10.3)
Chloride: 100 mmol/L (ref 98–111)
Creatinine, Ser: 0.89 mg/dL (ref 0.44–1.00)
GFR, Estimated: >60 mL/min (ref >60)
Glucose, Bld: 139 mg/dL — ABNORMAL HIGH (ref 70–99)
Potassium: 3 mmol/L — ABNORMAL LOW (ref 3.5–5.1)
Sodium: 136 mmol/L (ref 135–145)
Total Bilirubin: 1.2 mg/dL (ref 0.3–1.2)
Total Protein: 8.5 g/dL — ABNORMAL HIGH (ref 6.5–8.1)

## 2022-02-07 LAB — CBC
HCT: 46.4 % — ABNORMAL HIGH (ref 36.0–46.0)
Hemoglobin: 15.3 g/dL — ABNORMAL HIGH (ref 12.0–15.0)
MCH: 25.4 pg — ABNORMAL LOW (ref 26.0–34.0)
MCHC: 33 g/dL (ref 30.0–36.0)
MCV: 76.9 fL — ABNORMAL LOW (ref 80.0–100.0)
Platelets: 470 10*3/uL — ABNORMAL HIGH (ref 150–400)
RBC: 6.03 MIL/uL — ABNORMAL HIGH (ref 3.87–5.11)
RDW: 14.7 % (ref 11.5–15.5)
WBC: 17.1 10*3/uL — ABNORMAL HIGH (ref 4.0–10.5)
nRBC: 0 % (ref 0.0–0.2)

## 2022-02-07 LAB — URINALYSIS, ROUTINE W REFLEX MICROSCOPIC
Bilirubin Urine: NEGATIVE
Glucose, UA: NEGATIVE mg/dL
Ketones, ur: 80 mg/dL — AB
Leukocytes,Ua: NEGATIVE
Nitrite: NEGATIVE
Protein, ur: 100 mg/dL — AB
Specific Gravity, Urine: 1.027 (ref 1.005–1.030)
pH: 5 (ref 5.0–8.0)

## 2022-02-07 LAB — POCT PREGNANCY, URINE: Preg Test, Ur: POSITIVE — AB

## 2022-02-07 LAB — WET PREP, GENITAL
Clue Cells Wet Prep HPF POC: NONE SEEN
Sperm: NONE SEEN
Trich, Wet Prep: NONE SEEN
WBC, Wet Prep HPF POC: >=10 — AB (ref <10)
Yeast Wet Prep HPF POC: NONE SEEN

## 2022-02-07 LAB — HCG, QUANTITATIVE, PREGNANCY: hCG, Beta Chain, Quant, S: 103398 m[IU]/mL — ABNORMAL HIGH (ref <5)

## 2022-02-07 LAB — HIV ANTIBODY (ROUTINE TESTING W REFLEX): HIV Screen 4th Generation wRfx: NONREACTIVE

## 2022-02-07 MED ORDER — PROMETHAZINE HCL 25 MG PO TABS: 25.0000 mg | ORAL_TABLET | Freq: Four times a day (QID) | ORAL | 0 refills | Status: DC | PRN

## 2022-02-07 MED ORDER — FAMOTIDINE IN NACL 20-0.9 MG/50ML-% IV SOLN
20.0000 mg | Freq: Once | INTRAVENOUS | Status: AC
  Administered 2022-02-07: 20 mg via INTRAVENOUS
  Filled 2022-02-07: qty 50

## 2022-02-07 MED ORDER — ONDANSETRON 4 MG PO TBDP: 4.0000 mg | ORAL_TABLET | Freq: Three times a day (TID) | ORAL | 0 refills | Status: DC | PRN

## 2022-02-07 MED ORDER — SODIUM CHLORIDE 0.9 % IV SOLN: Freq: Once | INTRAVENOUS | Status: AC

## 2022-02-07 MED ORDER — DEXTROSE 5 % AND 0.9 % NACL IV BOLUS
1000.0000 mL | Freq: Once | INTRAVENOUS | Status: AC
  Administered 2022-02-07: 1000 mL via INTRAVENOUS

## 2022-02-07 MED ORDER — SODIUM CHLORIDE 0.9 % IV SOLN
8.0000 mg | Freq: Once | INTRAVENOUS | Status: AC
  Administered 2022-02-07: 8 mg via INTRAVENOUS
  Filled 2022-02-07: qty 4

## 2022-02-07 NOTE — MAU Note (Signed)
Tara Nguyen is a 26 y.o. at [redacted]w[redacted]d here in MAU reporting: nausea and vomiting, states not able to keep anything down for 3 days. >30 episodes of emesis in the past 24 hours. Having abdominal pain for the past couple of weeks. No vaginal bleeding. Endorses marijuana use yesterday.  ? ?LMP: 01/08/2022 ? ?Onset of complaint: ongoing ? ?Pain score: 10/10 ? ?Vitals:  ? 02/07/22 0922  ?BP: 111/79  ?Pulse: 94  ?Resp: 18  ?Temp: 97.6 ?F (36.4 ?C)  ?SpO2: 100%  ?   ?Lab orders placed from triage: UA ? ?

## 2022-02-07 NOTE — MAU Provider Note (Addendum)
?History  ? Tara Nguyen is a G62P1 26 y.o. at [redacted]w[redacted]d with a history of gestational DM and preelampsia in her previous pregnancy who presents with profuse vomiting, nausea, and abdominal pain. Patient reports that she has not been able to keep any food down for the past 3 days. She says she has had profuse vomiting over the past 24 hour hours that has mostly been clear fluid. She is experiencing significant abdominal pain. ? ?CSN: [id number] ? ?Arrival date and time: 02/07/22 0843 ? ?Provider' first contact with patient's bedside at 0915. ? ?Chief Complaint  ?Patient presents with  ? Abdominal Pain  ? Nausea  ? ?HPI ? ?OB History   ? ? Gravida  ?5  ? Para  ?1  ? Term  ?1  ? Preterm  ?   ? AB  ?3  ? Living  ?1  ?  ? ? SAB  ?2  ? IAB  ?1  ? Ectopic  ?   ? Multiple  ?0  ? Live Births  ?1  ?   ?  ?  ? ? ?Past Medical History:  ?Diagnosis Date  ? GDM (gestational diabetes mellitus) 07/2019  ? Hx of preeclampsia, prior pregnancy, currently pregnant 2020  ? Hx of trichomoniasis   ? ? ?Past Surgical History:  ?Procedure Laterality Date  ? NO PAST SURGERIES    ? ? ?History reviewed. No pertinent family history. ? ?Social History  ? ?Tobacco Use  ? Smoking status: Never  ? Smokeless tobacco: Never  ?Substance Use Topics  ? Alcohol use: Never  ? Drug use: Yes  ?  Types: Marijuana  ?  Comment: last used 02/06/2022  ? ? ?Allergies: No Known Allergies ? ?No medications prior to admission.  ? ? ?Review of Systems  ?Constitutional:  Positive for appetite change and chills.  ?HENT: Negative.    ?Eyes: Negative.   ?Respiratory: Negative.    ?Cardiovascular: Negative.   ?Gastrointestinal:  Positive for abdominal pain, nausea and vomiting.  ?Endocrine: Negative.   ?Genitourinary: Negative.   ?Musculoskeletal: Negative.   ?Skin: Negative.   ?Allergic/Immunologic: Negative.   ?Neurological:  Positive for weakness.  ?Hematological: Negative.   ?Psychiatric/Behavioral: Negative.    ?Physical Exam  ? ?Blood pressure 111/79, pulse 94,  temperature 97.6 ?F (36.4 ?C), temperature source Oral, resp. rate 18, last menstrual period 01/08/2022, SpO2 100 %. ? ?Physical Exam ?Constitutional:   ?   Appearance: She is ill-appearing.  ?HENT:  ?   Head: Normocephalic and atraumatic.  ?Cardiovascular:  ?   Rate and Rhythm: Normal rate and regular rhythm.  ?   Heart sounds: Normal heart sounds.  ?Pulmonary:  ?   Effort: Pulmonary effort is normal.  ?   Breath sounds: Normal breath sounds.  ?Abdominal:  ?   General: There is distension.  ?   Palpations: Abdomen is soft.  ?   Tenderness: There is generalized abdominal tenderness.  ?Musculoskeletal:  ?   Comments: Generalized back pain with tenderness on palpation.  ?Skin: ?   General: Skin is warm and dry.  ?Neurological:  ?   Mental Status: She is alert and oriented to person, place, and time.  ?  ? ?MAU Course  ?Procedures ? ?MDM ?Physical exam ?Labs: CBC, CMP, hCG ?Bolus D5NS and mIVF NaCl ?Zofran ?Ultrasound OB ? ?  ?Assessment and Plan  ?Tara Nguyen is a 70 y.0. at [redacted]w[redacted]d with a history of gestational DM and preelampsia in her previous pregnancy who presents with vomiting, nausea, and  abdominal pain. Her CMP and UA reflect ketoacidosis most likely from hyperemesis. She appears dehydrated and requires fluid resuscitation. ? ?Emesis and dehydration ?Bolus D5NS and mIVF NaCl ?Zofran 8 mg ?  ?Abdominal pain in 1st trimester ?Ultrasound OB ? ?Labs: CBC, CMP, hCG ? ?Pirapat Rerkpattanapipat ?02/07/2022, 11:49 AM  ? ?Attestation of Supervision of Student:  I confirm that I have verified the information documented in the medical student?s note and that I have also personally reperformed the history, physical exam and all medical decision making activities.  I have verified that all services and findings are accurately documented in this student's note; and I agree with management and plan as outlined in the documentation. I have also made any necessary editorial changes. ? ?Results for orders placed or performed  during the hospital encounter of 02/07/22 (from the past 24 hour(s))  ?Pregnancy, urine POC     Status: Abnormal  ? Collection Time: 02/07/22  9:01 AM  ?Result Value Ref Range  ? Preg Test, Ur POSITIVE (A) NEGATIVE  ?Urinalysis, Routine w reflex microscopic Urine, Clean Catch     Status: Abnormal  ? Collection Time: 02/07/22  9:14 AM  ?Result Value Ref Range  ? Color, Urine AMBER (A) YELLOW  ? APPearance HAZY (A) CLEAR  ? Specific Gravity, Urine 1.027 1.005 - 1.030  ? pH 5.0 5.0 - 8.0  ? Glucose, UA NEGATIVE NEGATIVE mg/dL  ? Hgb urine dipstick SMALL (A) NEGATIVE  ? Bilirubin Urine NEGATIVE NEGATIVE  ? Ketones, ur 80 (A) NEGATIVE mg/dL  ? Protein, ur 100 (A) NEGATIVE mg/dL  ? Nitrite NEGATIVE NEGATIVE  ? Leukocytes,Ua NEGATIVE NEGATIVE  ? RBC / HPF 0-5 0 - 5 RBC/hpf  ? WBC, UA 0-5 0 - 5 WBC/hpf  ? Bacteria, UA FEW (A) NONE SEEN  ? Squamous Epithelial / LPF 0-5 0 - 5  ? Mucus PRESENT   ?Wet prep, genital     Status: Abnormal  ? Collection Time: 02/07/22 10:08 AM  ?Result Value Ref Range  ? Yeast Wet Prep HPF POC NONE SEEN NONE SEEN  ? Trich, Wet Prep NONE SEEN NONE SEEN  ? Clue Cells Wet Prep HPF POC NONE SEEN NONE SEEN  ? WBC, Wet Prep HPF POC >=10 (A) <10  ? Sperm NONE SEEN   ?HIV Antibody (routine testing w rflx)     Status: None  ? Collection Time: 02/07/22 10:08 AM  ?Result Value Ref Range  ? HIV Screen 4th Generation wRfx Non Reactive Non Reactive  ?CBC     Status: Abnormal  ? Collection Time: 02/07/22 10:08 AM  ?Result Value Ref Range  ? WBC 17.1 (H) 4.0 - 10.5 K/uL  ? RBC 6.03 (H) 3.87 - 5.11 MIL/uL  ? Hemoglobin 15.3 (H) 12.0 - 15.0 g/dL  ? HCT 46.4 (H) 36.0 - 46.0 %  ? MCV 76.9 (L) 80.0 - 100.0 fL  ? MCH 25.4 (L) 26.0 - 34.0 pg  ? MCHC 33.0 30.0 - 36.0 g/dL  ? RDW 14.7 11.5 - 15.5 %  ? Platelets 470 (H) 150 - 400 K/uL  ? nRBC 0.0 0.0 - 0.2 %  ?hCG, quantitative, pregnancy     Status: Abnormal  ? Collection Time: 02/07/22 10:08 AM  ?Result Value Ref Range  ? hCG, Beta Chain, Quant, S 103,398 (H) <5 mIU/mL   ?Comprehensive metabolic panel     Status: Abnormal  ? Collection Time: 02/07/22 10:08 AM  ?Result Value Ref Range  ? Sodium 136 135 - 145 mmol/L  ?  Potassium 3.0 (L) 3.5 - 5.1 mmol/L  ? Chloride 100 98 - 111 mmol/L  ? CO2 19 (L) 22 - 32 mmol/L  ? Glucose, Bld 139 (H) 70 - 99 mg/dL  ? BUN 10 6 - 20 mg/dL  ? Creatinine, Ser 0.89 0.44 - 1.00 mg/dL  ? Calcium 10.1 8.9 - 10.3 mg/dL  ? Total Protein 8.5 (H) 6.5 - 8.1 g/dL  ? Albumin 4.6 3.5 - 5.0 g/dL  ? AST 17 15 - 41 U/L  ? ALT 17 0 - 44 U/L  ? Alkaline Phosphatase 66 38 - 126 U/L  ? Total Bilirubin 1.2 0.3 - 1.2 mg/dL  ? GFR, Estimated >60 >60 mL/min  ? Anion gap 17 (H) 5 - 15  ?  ?US OB Comp Less 14 Wks ? ?Result Date: 02/07/2022 ?CLINICAL DATA:  Pelvic pain. EXAM: OBSTETRIC <14 WK ULTRASOUND TECHNIQUE: Transabdominal ultrasound was performed for evaluation of the gestation as well as the maternal uterus and adnexal regions. COMPARISON:  None. FINDINGS: Intrauterine gestational sac: Single Yolk sac:  Visualized. Embryo:  Visualized. Cardiac Activity: Visualized. Heart Rate: 158 bpm CRL:   15.6 mm   7 w 6 d                  [place] [facility]: September 20, 2022. Subchorionic hemorrhage:  None visualized. Maternal uterus/adnexae: Corpus luteum cyst seen in right ovary. Left ovary is unremarkable. No free fluid is noted. IMPRESSION: Single live intrauterine gestation of 7 weeks 6 days. Electronically Signed   By: Lupita Raider M.D.   On: 02/07/2022 10:33   ? ?Patient notified of lab and normal [place] results. Advised of 7.6 IUP with (+) YS, embryo and FHR. [place] picture offered and patient accepted. Patient states she plans to terminate pregnancy. She has an appointment scheduled for 02/20/2022 at [facility] in [place], [place]. Patient admits to smoking marijuana to be able to eat. She states she only finds relief while in a hot shower. Patient advised to stop smoking marijuana as it is causing increased N/V and her abdominal pain. Rx ? ?Raelyn Mora, CNM ?Center for [employer], [facility] ?02/07/2022 2:36 PM ? ?

## 2022-02-08 ENCOUNTER — Inpatient Hospital Stay (HOSPITAL_COMMUNITY)
Admission: AD | Admit: 2022-02-08 | Discharge: 2022-02-10 | DRG: 833 | Disposition: A | Discharge status: Home / Self Care | Payer: Medicaid Other | Attending: Obstetrics & Gynecology | Admitting: Obstetrics & Gynecology

## 2022-02-08 ENCOUNTER — Other Ambulatory Visit: Payer: Self-pay

## 2022-02-08 ENCOUNTER — Encounter (HOSPITAL_COMMUNITY): Payer: Self-pay | Admitting: Family Medicine

## 2022-02-08 DIAGNOSIS — O211 Hyperemesis gravidarum with metabolic disturbance: Secondary | ICD-10-CM | POA: Diagnosis present

## 2022-02-08 DIAGNOSIS — F1290 Cannabis use, unspecified, uncomplicated: Secondary | ICD-10-CM | POA: Diagnosis not present

## 2022-02-08 DIAGNOSIS — O210 Mild hyperemesis gravidarum: Secondary | ICD-10-CM | POA: Diagnosis present

## 2022-02-08 DIAGNOSIS — R1110 Vomiting, unspecified: Principal | ICD-10-CM | POA: Diagnosis present

## 2022-02-08 DIAGNOSIS — O24111 Pre-existing diabetes mellitus, type 2, in pregnancy, first trimester: Secondary | ICD-10-CM | POA: Diagnosis present

## 2022-02-08 DIAGNOSIS — E119 Type 2 diabetes mellitus without complications: Secondary | ICD-10-CM

## 2022-02-08 DIAGNOSIS — Z3A01 Less than 8 weeks gestation of pregnancy: Secondary | ICD-10-CM | POA: Diagnosis not present

## 2022-02-08 DIAGNOSIS — Z3A08 8 weeks gestation of pregnancy: Secondary | ICD-10-CM | POA: Diagnosis not present

## 2022-02-08 DIAGNOSIS — R112 Nausea with vomiting, unspecified: Secondary | ICD-10-CM | POA: Diagnosis not present

## 2022-02-08 DIAGNOSIS — E876 Hypokalemia: Secondary | ICD-10-CM

## 2022-02-08 LAB — COMPREHENSIVE METABOLIC PANEL
ALT: 15 U/L (ref 0–44)
AST: 15 U/L (ref 15–41)
Albumin: 4 g/dL (ref 3.5–5.0)
Alkaline Phosphatase: 56 U/L (ref 38–126)
Anion gap: 13 (ref 5–15)
BUN: <5 mg/dL — ABNORMAL LOW (ref 6–20)
CO2: 22 mmol/L (ref 22–32)
Calcium: 9.1 mg/dL (ref 8.9–10.3)
Chloride: 102 mmol/L (ref 98–111)
Creatinine, Ser: 0.82 mg/dL (ref 0.44–1.00)
GFR, Estimated: >60 mL/min (ref >60)
Glucose, Bld: 102 mg/dL — ABNORMAL HIGH (ref 70–99)
Potassium: 2.6 mmol/L — CL (ref 3.5–5.1)
Sodium: 137 mmol/L (ref 135–145)
Total Bilirubin: 0.9 mg/dL (ref 0.3–1.2)
Total Protein: 7.1 g/dL (ref 6.5–8.1)

## 2022-02-08 LAB — GC/CHLAMYDIA PROBE AMP (~~LOC~~) NOT AT ARMC
Chlamydia: NEGATIVE
Comment: NEGATIVE
Comment: NORMAL
Neisseria Gonorrhea: NEGATIVE

## 2022-02-08 LAB — CBC WITH DIFFERENTIAL/PLATELET
Abs Immature Granulocytes: 0.09 10*3/uL — ABNORMAL HIGH (ref 0.00–0.07)
Basophils Absolute: 0 10*3/uL (ref 0.0–0.1)
Basophils Relative: 0 %
Eosinophils Absolute: 0.1 10*3/uL (ref 0.0–0.5)
Eosinophils Relative: 1 %
HCT: 40.4 % (ref 36.0–46.0)
Hemoglobin: 13.5 g/dL (ref 12.0–15.0)
Immature Granulocytes: 1 %
Lymphocytes Relative: 12 %
Lymphs Abs: 2 10*3/uL (ref 0.7–4.0)
MCH: 25.8 pg — ABNORMAL LOW (ref 26.0–34.0)
MCHC: 33.4 g/dL (ref 30.0–36.0)
MCV: 77.1 fL — ABNORMAL LOW (ref 80.0–100.0)
Monocytes Absolute: 0.7 10*3/uL (ref 0.1–1.0)
Monocytes Relative: 4 %
Neutro Abs: 13.5 10*3/uL — ABNORMAL HIGH (ref 1.7–7.7)
Neutrophils Relative %: 82 %
Platelets: 370 10*3/uL (ref 150–400)
RBC: 5.24 MIL/uL — ABNORMAL HIGH (ref 3.87–5.11)
RDW: 14.6 % (ref 11.5–15.5)
WBC: 16.4 10*3/uL — ABNORMAL HIGH (ref 4.0–10.5)
nRBC: 0 % (ref 0.0–0.2)

## 2022-02-08 LAB — HEMOGLOBIN A1C
Hgb A1c MFr Bld: 6.6 % — ABNORMAL HIGH (ref 4.8–5.6)
Mean Plasma Glucose: 142.72 mg/dL

## 2022-02-08 LAB — LACTIC ACID, PLASMA: Lactic Acid, Venous: 1.4 mmol/L (ref 0.5–1.9)

## 2022-02-08 LAB — TSH: TSH: 0.936 u[IU]/mL (ref 0.350–4.500)

## 2022-02-08 MED ORDER — CALCIUM CARBONATE ANTACID 500 MG PO CHEW: 2.0000 | CHEWABLE_TABLET | ORAL | Status: DC | PRN

## 2022-02-08 MED ORDER — DOCUSATE SODIUM 100 MG PO CAPS: 100.0000 mg | ORAL_CAPSULE | Freq: Every day | ORAL | Status: DC

## 2022-02-08 MED ORDER — POTASSIUM CHLORIDE 10 MEQ/100ML IV SOLN
10.0000 meq | INTRAVENOUS | Status: AC
  Administered 2022-02-09 (×6): 10 meq via INTRAVENOUS
  Filled 2022-02-08 (×6): qty 100

## 2022-02-08 MED ORDER — ACETAMINOPHEN 325 MG PO TABS
650.0000 mg | ORAL_TABLET | ORAL | Status: DC | PRN
Start: 2022-02-08 — End: 2022-02-10

## 2022-02-08 MED ORDER — LACTATED RINGERS IV SOLN
Freq: Once | INTRAVENOUS | Status: DC
  Filled 2022-02-08: qty 1000

## 2022-02-08 MED ORDER — ONDANSETRON HCL 4 MG/2ML IJ SOLN
4.0000 mg | Freq: Four times a day (QID) | INTRAMUSCULAR | Status: DC
  Administered 2022-02-08 – 2022-02-10 (×6): 4 mg via INTRAVENOUS
  Filled 2022-02-08 (×6): qty 2

## 2022-02-08 MED ORDER — PRENATAL MULTIVITAMIN CH
1.0000 | ORAL_TABLET | Freq: Every day | ORAL | Status: DC
  Filled 2022-02-08: qty 1

## 2022-02-08 MED ORDER — FAMOTIDINE IN NACL 20-0.9 MG/50ML-% IV SOLN
20.0000 mg | Freq: Once | INTRAVENOUS | Status: AC
Start: 2022-02-08 — End: 2022-02-08
  Administered 2022-02-08: 20 mg via INTRAVENOUS
  Filled 2022-02-08: qty 50

## 2022-02-08 MED ORDER — ZOLPIDEM TARTRATE 5 MG PO TABS: 5.0000 mg | ORAL_TABLET | Freq: Every evening | ORAL | Status: DC | PRN

## 2022-02-08 MED ORDER — SCOPOLAMINE 1 MG/3DAYS TD PT72
1.0000 | MEDICATED_PATCH | TRANSDERMAL | Status: DC
  Administered 2022-02-08: 1.5 mg via TRANSDERMAL
  Filled 2022-02-08: qty 1

## 2022-02-08 MED ORDER — LACTATED RINGERS IV BOLUS
1000.0000 mL | Freq: Once | INTRAVENOUS | Status: AC
  Administered 2022-02-08: 1000 mL via INTRAVENOUS

## 2022-02-08 MED ORDER — SODIUM CHLORIDE 0.9 % IV SOLN
250.0000 mL | INTRAVENOUS | Status: DC | PRN
  Administered 2022-02-09: 250 mL via INTRAVENOUS

## 2022-02-08 MED ORDER — M.V.I. ADULT IV INJ
Freq: Once | INTRAVENOUS | Status: AC
  Filled 2022-02-08: qty 1000

## 2022-02-08 MED ORDER — LACTATED RINGERS IV BOLUS
1000.0000 mL | Freq: Once | INTRAVENOUS | Status: AC
Start: 2022-02-08 — End: 2022-02-08
  Administered 2022-02-08: 1000 mL via INTRAVENOUS

## 2022-02-08 MED ORDER — SODIUM CHLORIDE 0.9 % IV SOLN
8.0000 mg | Freq: Once | INTRAVENOUS | Status: AC
  Administered 2022-02-08: 8 mg via INTRAVENOUS
  Filled 2022-02-08: qty 4

## 2022-02-08 MED ORDER — M.V.I. ADULT IV INJ
Freq: Once | INTRAVENOUS | Status: DC
  Filled 2022-02-08: qty 125

## 2022-02-08 MED ORDER — SODIUM CHLORIDE 0.9% FLUSH: 3.0000 mL | Freq: Two times a day (BID) | INTRAVENOUS | Status: DC

## 2022-02-08 MED ORDER — PROCHLORPERAZINE EDISYLATE 10 MG/2ML IJ SOLN
10.0000 mg | Freq: Four times a day (QID) | INTRAMUSCULAR | Status: DC
  Administered 2022-02-08 – 2022-02-10 (×6): 10 mg via INTRAVENOUS
  Filled 2022-02-08 (×7): qty 2

## 2022-02-08 MED ORDER — ENOXAPARIN SODIUM 40 MG/0.4ML IJ SOSY
40.0000 mg | PREFILLED_SYRINGE | INTRAMUSCULAR | Status: DC
  Administered 2022-02-09: 40 mg via SUBCUTANEOUS
  Filled 2022-02-08: qty 0.4

## 2022-02-08 NOTE — MAU Note (Signed)
CRITICAL VALUE STICKER ? ?CRITICAL VALUE: Potassium of 2.6 ? ?RECEIVER (on-site recipient of call):Levelle Edelen RNC ? ?DATE & TIME NOTIFIED: 02/08/2022  at 2159 ? ?MESSENGER (representative from lab): ? ?MD NOTIFIED: Venia Carbon NP ? ?TIME OF NOTIFICATION:2200 ? ?RESPONSE: NP will discuss with Dr Shawnie Pons ?

## 2022-02-08 NOTE — H&P (Signed)
?CSN: [id number] ? ?Arrival date and time: 02/08/22 2009 ? ? None  ?  ? ?Chief Complaint  ?Patient presents with  ? Emesis During Pregnancy  ? ?HPI ? ?Tara Nguyen is a 26 y.o. female [id number] @ 1880w0d here in MAU with complaints of nausea and vomiting. This is her 2nd visit to MAU with the same complaint. She was sent home yesterday after 2 liters of fluid and an RX for Zofran. The patient reports attempting to take the Zofran and vomited the medication up. She was told yesterday that her symptoms were likely 2/2 to cannabis use as the patient reported several hours of time spent in the shower in an effort to help her nausea. Patient reports close to a 20 lbs weight loss since finding out she is pregnant.   ? ?Of note patient vomiting upon arrival into room. Patient unable to urinate upton arrival.  ? ?OB History   ? ? Gravida  ?5  ? Para  ?1  ? Term  ?1  ? Preterm  ?   ? AB  ?3  ? Living  ?1  ?  ? ? SAB  ?2  ? IAB  ?1  ? Ectopic  ?   ? Multiple  ?0  ? Live Births  ?1  ?   ?  ?  ? ? ?Past Medical History:  ?Diagnosis Date  ? GDM (gestational diabetes mellitus) 07/2019  ? Hx of preeclampsia, prior pregnancy, currently pregnant 2020  ? Hx of trichomoniasis   ? ? ?Past Surgical History:  ?Procedure Laterality Date  ? NO PAST SURGERIES    ? ? ?History reviewed. No pertinent family history. ? ?Social History  ? ?Tobacco Use  ? Smoking status: Never  ? Smokeless tobacco: Never  ?Substance Use Topics  ? Alcohol use: Never  ? Drug use: Yes  ?  Types: Marijuana  ?  Comment: last used 02/06/2022  ? ? ?Allergies: No Known Allergies ? ?Medications Prior to Admission  ?Medication Sig Dispense Refill Last Dose  ? ondansetron (ZOFRAN-ODT) 4 MG disintegrating tablet Take 1 tablet (4 mg total) by mouth every 8 (eight) hours as needed for nausea or vomiting. 15 tablet 0   ? promethazine (PHENERGAN) 25 MG tablet Take 1 tablet (25 mg total) by mouth every 6 (six) hours as needed for nausea or vomiting. 30 tablet 0   ? ?Results for  orders placed or performed during the hospital encounter of 02/08/22 (from the past 48 hour(s))  ?Comprehensive metabolic panel     Status: Abnormal  ? Collection Time: 02/08/22  9:10 PM  ?Result Value Ref Range  ? Sodium 137 135 - 145 mmol/L  ? Potassium 2.6 (LL) 3.5 - 5.1 mmol/L  ?  Comment: CRITICAL RESULT CALLED TO, READ BACK BY AND VERIFIED WITH: ?Ayesha MohairBARTHAM J,RN 02/08/22 2157 WAYK ?  ? Chloride 102 98 - 111 mmol/L  ? CO2 22 22 - 32 mmol/L  ? Glucose, Bld 102 (H) 70 - 99 mg/dL  ?  Comment: Glucose reference range applies only to samples taken after fasting for at least 8 hours.  ? BUN <5 (L) 6 - 20 mg/dL  ? Creatinine, Ser 0.82 0.44 - 1.00 mg/dL  ? Calcium 9.1 8.9 - 10.3 mg/dL  ? Total Protein 7.1 6.5 - 8.1 g/dL  ? Albumin 4.0 3.5 - 5.0 g/dL  ? AST 15 15 - 41 U/L  ? ALT 15 0 - 44 U/L  ? Alkaline Phosphatase 56 38 - 126 U/L  ?  Total Bilirubin 0.9 0.3 - 1.2 mg/dL  ? GFR, Estimated >60 >60 mL/min  ?  Comment: (NOTE) ?Calculated using the CKD-EPI Creatinine Equation (2021) ?  ? Anion gap 13 5 - 15  ?  Comment: Performed at [facility], [address] [place], [place] [address]  ?Hemoglobin A1c     Status: Abnormal  ? Collection Time: 02/08/22  9:10 PM  ?Result Value Ref Range  ? Hgb A1c MFr Bld 6.6 (H) 4.8 - 5.6 %  ?  Comment: (NOTE) ?Pre diabetes:          5.7%-6.4% ? ?Diabetes:              >6.4% ? ?Glycemic control for   <7.0% ?adults with diabetes ?  ? Mean Plasma Glucose 142.72 mg/dL  ?  Comment: Performed at [facility], [address], [place], [place] [address]  ?TSH     Status: None  ? Collection Time: 02/08/22  9:10 PM  ?Result Value Ref Range  ? TSH 0.936 0.350 - 4.500 uIU/mL  ?  Comment: Performed by a 3rd Generation assay with a functional sensitivity of <=0.01 uIU/mL. ?Performed at [facility], [address], [place], [place] [address] ?  ?CBC with Differential/Platelet     Status: Abnormal  ? Collection Time: 02/08/22  9:10 PM  ?Result Value Ref Range  ? WBC 16.4 (H) 4.0 - 10.5  K/uL  ? RBC 5.24 (H) 3.87 - 5.11 MIL/uL  ? Hemoglobin 13.5 12.0 - 15.0 g/dL  ? HCT 40.4 36.0 - 46.0 %  ? MCV 77.1 (L) 80.0 - 100.0 fL  ? MCH 25.8 (L) 26.0 - 34.0 pg  ? MCHC 33.4 30.0 - 36.0 g/dL  ? RDW 14.6 11.5 - 15.5 %  ? Platelets 370 150 - 400 K/uL  ? nRBC 0.0 0.0 - 0.2 %  ? Neutrophils Relative % 82 %  ? Neutro Abs 13.5 (H) 1.7 - 7.7 K/uL  ? Lymphocytes Relative 12 %  ? Lymphs Abs 2.0 0.7 - 4.0 K/uL  ? Monocytes Relative 4 %  ? Monocytes Absolute 0.7 0.1 - 1.0 K/uL  ? Eosinophils Relative 1 %  ? Eosinophils Absolute 0.1 0.0 - 0.5 K/uL  ? Basophils Relative 0 %  ? Basophils Absolute 0.0 0.0 - 0.1 K/uL  ? Immature Granulocytes 1 %  ? Abs Immature Granulocytes 0.09 (H) 0.00 - 0.07 K/uL  ?  Comment: Performed at [facility], [address] [place], [place] [address]  ?Lactic acid, plasma     Status: None  ? Collection Time: 02/08/22  9:18 PM  ?Result Value Ref Range  ? Lactic Acid, Venous 1.4 0.5 - 1.9 mmol/L  ?  Comment: Performed at [facility], [address] [place], [place] [address]  ?  ?Review of Systems  ?Gastrointestinal:  Positive for nausea and vomiting. Negative for diarrhea.  ?Physical Exam  ? ?Blood pressure 122/83, pulse 82, temperature 98.6 ?F (37 ?C), resp. rate 20, height 5' (1.524 m), weight 72.1 kg, last menstrual period 01/08/2022, SpO2 100 %. ? ?Physical Exam ?Constitutional:   ?   Appearance: She is ill-appearing.  ?Eyes:  ?   Pupils: Pupils are equal, round, and reactive to light.  ?Cardiovascular:  ?   Rate and Rhythm: Normal rate.  ?Pulmonary:  ?   Effort: Pulmonary effort is normal.  ?   Breath sounds: Normal breath sounds.  ?Skin: ?   General: Skin is warm.  ?  Neurological:  ?   Mental Status: She is alert and oriented to person, place, and time.  ?Psychiatric:     ?   Behavior: Behavior normal.  ? ?Assessment and Plan  ? ?A: ? ?1. Hyperemesis   ?2. [redacted] weeks gestation of pregnancy   ?3. Hypokalemia   ?4. Type 2 diabetes mellitus without complication, unspecified whether  long term insulin use (HCC)   ?  ? ?P: ? ? ?Admit to [facility] ?K + replacement ?Scope patch placed ?Fluid resuscitation in process.  ? ?Duane Lope, NP ?02/08/2022 ?10:41 PM ? ?

## 2022-02-08 NOTE — MAU Note (Signed)
Unable to void currently

## 2022-02-08 NOTE — MAU Provider Note (Signed)
?History  ?  ? ?CSN: [id number] ? ?Arrival date and time: 02/08/22 2009 ? ? None  ?  ? ?Chief Complaint  ?Patient presents with  ? Emesis During Pregnancy  ? ?HPI ? ?[name] is a 26 y.o. female [phone] @ [redacted]w[redacted]d here in MAU with complaints of nausea and vomiting. This is her 2nd visit to MAU with the same complaint. She was sent home yesterday after 2 liters of fluid and an RX for Zofran. The patient reports attempting to take the Zofran and vomited the medication up. She was told yesterday that her symptoms were likely 2/2 to cannabis use as the patient reported several hours of time spent in the shower in an effort to help her nausea. Patient reports close to a 20 lbs weight loss since finding out she is pregnant.   ? ?Of note patient vomiting upon arrival into room. Patient unable to urinate upton arrival.  ? ?OB History   ? ? Gravida  ?5  ? Para  ?1  ? Term  ?1  ? Preterm  ?   ? AB  ?3  ? Living  ?1  ?  ? ? SAB  ?2  ? IAB  ?1  ? Ectopic  ?   ? Multiple  ?0  ? Live Births  ?1  ?   ?  ?  ? ? ?Past Medical History:  ?Diagnosis Date  ? GDM (gestational diabetes mellitus) 07/2019  ? Hx of preeclampsia, prior pregnancy, currently pregnant 2020  ? Hx of trichomoniasis   ? ? ?Past Surgical History:  ?Procedure Laterality Date  ? NO PAST SURGERIES    ? ? ?History reviewed. No pertinent family history. ? ?Social History  ? ?Tobacco Use  ? Smoking status: Never  ? Smokeless tobacco: Never  ?Substance Use Topics  ? Alcohol use: Never  ? Drug use: Yes  ?  Types: Marijuana  ?  Comment: last used 02/06/2022  ? ? ?Allergies: No Known Allergies ? ?Medications Prior to Admission  ?Medication Sig Dispense Refill Last Dose  ? ondansetron (ZOFRAN-ODT) 4 MG disintegrating tablet Take 1 tablet (4 mg total) by mouth every 8 (eight) hours as needed for nausea or vomiting. 15 tablet 0   ? promethazine (PHENERGAN) 25 MG tablet Take 1 tablet (25 mg total) by mouth every 6 (six) hours as needed for nausea or vomiting. 30 tablet 0    ? ?Results for orders placed or performed during the hospital encounter of 02/08/22 (from the past 48 hour(s))  ?Comprehensive metabolic panel     Status: Abnormal  ? Collection Time: 02/08/22  9:10 PM  ?Result Value Ref Range  ? Sodium 137 135 - 145 mmol/L  ? Potassium 2.6 (LL) 3.5 - 5.1 mmol/L  ?  Comment: CRITICAL RESULT CALLED TO, READ BACK BY AND VERIFIED WITH: ?Ayesha Mohair 02/08/22 2157 WAYK ?  ? Chloride 102 98 - 111 mmol/L  ? CO2 22 22 - 32 mmol/L  ? Glucose, Bld 102 (H) 70 - 99 mg/dL  ?  Comment: Glucose reference range applies only to samples taken after fasting for at least 8 hours.  ? BUN <5 (L) 6 - 20 mg/dL  ? Creatinine, Ser 0.82 0.44 - 1.00 mg/dL  ? Calcium 9.1 8.9 - 10.3 mg/dL  ? Total Protein 7.1 6.5 - 8.1 g/dL  ? Albumin 4.0 3.5 - 5.0 g/dL  ? AST 15 15 - 41 U/L  ? ALT 15 0 - 44 U/L  ? Alkaline Phosphatase 56  38 - 126 U/L  ? Total Bilirubin 0.9 0.3 - 1.2 mg/dL  ? GFR, Estimated >60 >60 mL/min  ?  Comment: (NOTE) ?Calculated using the CKD-EPI Creatinine Equation (2021) ?  ? Anion gap 13 5 - 15  ?  Comment: Performed at [facility], [address] [place], [place] [address]  ?Hemoglobin A1c     Status: Abnormal  ? Collection Time: 02/08/22  9:10 PM  ?Result Value Ref Range  ? Hgb A1c MFr Bld 6.6 (H) 4.8 - 5.6 %  ?  Comment: (NOTE) ?Pre diabetes:          5.7%-6.4% ? ?Diabetes:              >6.4% ? ?Glycemic control for   <7.0% ?adults with diabetes ?  ? Mean Plasma Glucose 142.72 mg/dL  ?  Comment: Performed at [facility], [address] [place], [place] [address]  ?TSH     Status: None  ? Collection Time: 02/08/22  9:10 PM  ?Result Value Ref Range  ? TSH 0.936 0.350 - 4.500 uIU/mL  ?  Comment: Performed by a 3rd Generation assay with a functional sensitivity of <=0.01 uIU/mL. ?Performed at [facility], [address] [place], [place] [address] ?  ?CBC with Differential/Platelet     Status: Abnormal  ? Collection Time: 02/08/22  9:10 PM  ?Result Value Ref Range  ? WBC 16.4  (H) 4.0 - 10.5 K/uL  ? RBC 5.24 (H) 3.87 - 5.11 MIL/uL  ? Hemoglobin 13.5 12.0 - 15.0 g/dL  ? HCT 40.4 36.0 - 46.0 %  ? MCV 77.1 (L) 80.0 - 100.0 fL  ? MCH 25.8 (L) 26.0 - 34.0 pg  ? MCHC 33.4 30.0 - 36.0 g/dL  ? RDW 14.6 11.5 - 15.5 %  ? Platelets 370 150 - 400 K/uL  ? nRBC 0.0 0.0 - 0.2 %  ? Neutrophils Relative % 82 %  ? Neutro Abs 13.5 (H) 1.7 - 7.7 K/uL  ? Lymphocytes Relative 12 %  ? Lymphs Abs 2.0 0.7 - 4.0 K/uL  ? Monocytes Relative 4 %  ? Monocytes Absolute 0.7 0.1 - 1.0 K/uL  ? Eosinophils Relative 1 %  ? Eosinophils Absolute 0.1 0.0 - 0.5 K/uL  ? Basophils Relative 0 %  ? Basophils Absolute 0.0 0.0 - 0.1 K/uL  ? Immature Granulocytes 1 %  ? Abs Immature Granulocytes 0.09 (H) 0.00 - 0.07 K/uL  ?  Comment: Performed at [facility], [address] [place], [place] [address]  ?Lactic acid, plasma     Status: None  ? Collection Time: 02/08/22  9:18 PM  ?Result Value Ref Range  ? Lactic Acid, Venous 1.4 0.5 - 1.9 mmol/L  ?  Comment: Performed at [facility], [address] [place], [place] [address]  ?  ?Review of Systems  ?Gastrointestinal:  Positive for nausea and vomiting. Negative for diarrhea.  ?Physical Exam  ? ?Blood pressure 122/83, pulse 82, temperature 98.6 ?F (37 ?C), resp. rate 20, height 5' (1.524 m), weight 72.1 kg, last menstrual period 01/08/2022, SpO2 100 %. ? ?Physical Exam ?Constitutional:   ?   Appearance: She is ill-appearing.  ?Eyes:  ?   Pupils: Pupils are equal, round, and reactive to light.  ?Cardiovascular:  ?   Rate and Rhythm: Normal rate.  ?Pulmonary:  ?   Effort: Pulmonary effort is normal.  ?   Breath sounds: Normal breath sounds.  ?Skin: ?  General: Skin is warm.  ?Neurological:  ?   Mental Status: She is alert and oriented to person, place, and time.  ?Psychiatric:     ?   Behavior: Behavior normal.  ? ?MAU Course  ?Procedures ? ?MDM ? ?Lr bolus x2 ?MVI X 1 ?CMP & CMP ?K+ now down to 2.6 ?Discussed patient with Dr. Shawnie Pons for admission. ? ?Assessment and Plan   ? ?A: ? ?1. Hyperemesis   ?2. [redacted] weeks gestation of pregnancy   ?3. Hypokalemia   ?4. Type 2 diabetes mellitus without complication, unspecified whether long term insulin use (HCC)   ?  ? ?P: ? ? ?Admit to [facility] ?K + replacement ?Scope patch placed ?Fluid resuscitation in process.  ? ?Duane Lope, NP ?02/08/2022 ?10:41 PM ? ?

## 2022-02-08 NOTE — MAU Note (Addendum)
Pt is G5P1 at 8wk. States has not been able to eat for 5 days. Nausea meds not working. Throat dry and nose is bleeding. Having pressure in chest.  Was told to stop smoking weed and have not smoked any since yesterday. No vag bleeding ?

## 2022-02-09 DIAGNOSIS — R1110 Vomiting, unspecified: Secondary | ICD-10-CM

## 2022-02-09 DIAGNOSIS — Z3A08 8 weeks gestation of pregnancy: Secondary | ICD-10-CM

## 2022-02-09 LAB — COMPREHENSIVE METABOLIC PANEL
ALT: 11 U/L (ref 0–44)
AST: 10 U/L — ABNORMAL LOW (ref 15–41)
Albumin: 3 g/dL — ABNORMAL LOW (ref 3.5–5.0)
Alkaline Phosphatase: 43 U/L (ref 38–126)
Anion gap: 6 (ref 5–15)
BUN: <5 mg/dL — ABNORMAL LOW (ref 6–20)
CO2: 24 mmol/L (ref 22–32)
Calcium: 8.5 mg/dL — ABNORMAL LOW (ref 8.9–10.3)
Chloride: 106 mmol/L (ref 98–111)
Creatinine, Ser: 0.76 mg/dL (ref 0.44–1.00)
GFR, Estimated: >60 mL/min (ref >60)
Glucose, Bld: 68 mg/dL — ABNORMAL LOW (ref 70–99)
Potassium: 3.2 mmol/L — ABNORMAL LOW (ref 3.5–5.1)
Sodium: 136 mmol/L (ref 135–145)
Total Bilirubin: 1.1 mg/dL (ref 0.3–1.2)
Total Protein: 5.8 g/dL — ABNORMAL LOW (ref 6.5–8.1)

## 2022-02-09 MED ORDER — POTASSIUM CHLORIDE 10 MEQ/100ML IV SOLN
10.0000 meq | INTRAVENOUS | Status: AC
  Administered 2022-02-09 (×6): 10 meq via INTRAVENOUS
  Filled 2022-02-09 (×6): qty 100

## 2022-02-09 MED ORDER — SODIUM CHLORIDE 0.9 % IV SOLN: 250.0000 mL | INTRAVENOUS | Status: DC | PRN

## 2022-02-09 NOTE — Progress Notes (Signed)
Patient ID: Manar Smalling, female   DOB: [birth year], 26 y.o.   MRN: [id number] ? ? ?Assessment/Plan: ?Principal Problem: ?  Hyperemesis ? ?Hyperemesis ?Scopolamine patch ?Compazine scheduled ?Zofran scheduled ?Pepcid ?Normal TSH ?Advance diet slowly if feels up to it. ? ? ?Subjective: ?Interval History:feels some better, still with Nausea, NPO ? ?Objective: ?Vital signs in last 24 hours: ?Temp:  [98.1 ?F (36.7 ?C)-98.8 ?F (37.1 ?C)] 98.6 ?F (37 ?C) (04/07 0804) ?Pulse Rate:  [77-91] 77 (04/07 0804) ?Resp:  [16-20] 16 (04/07 0804) ?BP: (104-122)/(63-83) 105/64 (04/07 0804) ?SpO2:  [98 %-100 %] 98 % (04/07 0400) ?Weight:  [72.1 kg-74.3 kg] 74.3 kg (04/07 0500) ? ?Intake/Output from previous day: ?04/06 0701 - 04/07 0700 ?In: 740.9 [P.O.:80; I.V.:159.7] ?Out: 0  ?Intake/Output this shift: ?No intake/output data recorded. ? ?General appearance: alert, cooperative, and appears stated age ?Head: Normocephalic, without obvious abnormality, atraumatic ?Neck: supple, symmetrical, trachea midline ?Lungs: normal effort ?Heart: regular rate and rhythm ?Abdomen: soft, non-tender; bowel sounds normal; no masses,  no organomegaly ?Extremities: extremities normal, atraumatic, no cyanosis or edema ?Skin: Skin color, texture, turgor normal. No rashes or lesions ?Neurologic: Grossly normal ? ?Results for orders placed or performed during the hospital encounter of 02/08/22 (from the past 24 hour(s))  ?Comprehensive metabolic panel     Status: Abnormal  ? Collection Time: 02/08/22  9:10 PM  ?Result Value Ref Range  ? Sodium 137 135 - 145 mmol/L  ? Potassium 2.6 (LL) 3.5 - 5.1 mmol/L  ? Chloride 102 98 - 111 mmol/L  ? CO2 22 22 - 32 mmol/L  ? Glucose, Bld 102 (H) 70 - 99 mg/dL  ? BUN <5 (L) 6 - 20 mg/dL  ? Creatinine, Ser 0.82 0.44 - 1.00 mg/dL  ? Calcium 9.1 8.9 - 10.3 mg/dL  ? Total Protein 7.1 6.5 - 8.1 g/dL  ? Albumin 4.0 3.5 - 5.0 g/dL  ? AST 15 15 - 41 U/L  ? ALT 15 0 - 44 U/L  ? Alkaline Phosphatase 56 38 - 126 U/L  ? Total  Bilirubin 0.9 0.3 - 1.2 mg/dL  ? GFR, Estimated >60 >60 mL/min  ? Anion gap 13 5 - 15  ?Hemoglobin A1c     Status: Abnormal  ? Collection Time: 02/08/22  9:10 PM  ?Result Value Ref Range  ? Hgb A1c MFr Bld 6.6 (H) 4.8 - 5.6 %  ? Mean Plasma Glucose 142.72 mg/dL  ?TSH     Status: None  ? Collection Time: 02/08/22  9:10 PM  ?Result Value Ref Range  ? TSH 0.936 0.350 - 4.500 uIU/mL  ?CBC with Differential/Platelet     Status: Abnormal  ? Collection Time: 02/08/22  9:10 PM  ?Result Value Ref Range  ? WBC 16.4 (H) 4.0 - 10.5 K/uL  ? RBC 5.24 (H) 3.87 - 5.11 MIL/uL  ? Hemoglobin 13.5 12.0 - 15.0 g/dL  ? HCT 40.4 36.0 - 46.0 %  ? MCV 77.1 (L) 80.0 - 100.0 fL  ? MCH 25.8 (L) 26.0 - 34.0 pg  ? MCHC 33.4 30.0 - 36.0 g/dL  ? RDW 14.6 11.5 - 15.5 %  ? Platelets 370 150 - 400 K/uL  ? nRBC 0.0 0.0 - 0.2 %  ? Neutrophils Relative % 82 %  ? Neutro Abs 13.5 (H) 1.7 - 7.7 K/uL  ? Lymphocytes Relative 12 %  ? Lymphs Abs 2.0 0.7 - 4.0 K/uL  ? Monocytes Relative 4 %  ? Monocytes Absolute 0.7 0.1 - 1.0 K/uL  ? Eosinophils  Relative 1 %  ? Eosinophils Absolute 0.1 0.0 - 0.5 K/uL  ? Basophils Relative 0 %  ? Basophils Absolute 0.0 0.0 - 0.1 K/uL  ? Immature Granulocytes 1 %  ? Abs Immature Granulocytes 0.09 (H) 0.00 - 0.07 K/uL  ?Lactic acid, plasma     Status: None  ? Collection Time: 02/08/22  9:18 PM  ?Result Value Ref Range  ? Lactic Acid, Venous 1.4 0.5 - 1.9 mmol/L  ?Comprehensive metabolic panel     Status: Abnormal  ? Collection Time: 02/09/22  5:20 AM  ?Result Value Ref Range  ? Sodium 136 135 - 145 mmol/L  ? Potassium 3.2 (L) 3.5 - 5.1 mmol/L  ? Chloride 106 98 - 111 mmol/L  ? CO2 24 22 - 32 mmol/L  ? Glucose, Bld 68 (L) 70 - 99 mg/dL  ? BUN <5 (L) 6 - 20 mg/dL  ? Creatinine, Ser 0.76 0.44 - 1.00 mg/dL  ? Calcium 8.5 (L) 8.9 - 10.3 mg/dL  ? Total Protein 5.8 (L) 6.5 - 8.1 g/dL  ? Albumin 3.0 (L) 3.5 - 5.0 g/dL  ? AST 10 (L) 15 - 41 U/L  ? ALT 11 0 - 44 U/L  ? Alkaline Phosphatase 43 38 - 126 U/L  ? Total Bilirubin 1.1 0.3 - 1.2  mg/dL  ? GFR, Estimated >60 >60 mL/min  ? Anion gap 6 5 - 15  ? ? ?Studies/Results: ?US OB Comp Less 14 Wks ? ?Result Date: 02/07/2022 ?CLINICAL DATA:  Pelvic pain. EXAM: OBSTETRIC <14 WK ULTRASOUND TECHNIQUE: Transabdominal ultrasound was performed for evaluation of the gestation as well as the maternal uterus and adnexal regions. COMPARISON:  None. FINDINGS: Intrauterine gestational sac: Single Yolk sac:  Visualized. Embryo:  Visualized. Cardiac Activity: Visualized. Heart Rate: 158 bpm CRL:   15.6 mm   7 w 6 d                  [facility] [facility]: September 20, 2022. Subchorionic hemorrhage:  None visualized. Maternal uterus/adnexae: Corpus luteum cyst seen in right ovary. Left ovary is unremarkable. No free fluid is noted. IMPRESSION: Single live intrauterine gestation of 7 weeks 6 days. Electronically Signed   By: Lupita Raider M.D.   On: 02/07/2022 10:33   ? ?Scheduled Meds: ? docusate sodium  100 mg Oral Daily  ? enoxaparin (LOVENOX) injection  40 mg Subcutaneous Q24H  ? ondansetron (ZOFRAN) IV  4 mg Intravenous Q6H  ? prenatal multivitamin  1 tablet Oral Q1200  ? prochlorperazine  10 mg Intravenous Q6H  ? scopolamine  1 patch Transdermal Q72H  ? sodium chloride flush  3 mL Intravenous Q12H  ? ?Continuous Infusions: ? sodium chloride    ? ?PRN Meds:sodium chloride, acetaminophen, calcium carbonate, zolpidem ? ? ? LOS: 1 day  ? ?Reva Bores, MD ?02/09/2022 ?8:48 AM ? ?

## 2022-02-09 NOTE — Progress Notes (Signed)
Inpatient Diabetes Program Recommendations ? ?AACE/ADA: New Consensus Statement on Inpatient Glycemic Control (2015) ? ?Target Ranges:  Prepandial:   less than 140 mg/dL ?     Peak postprandial:   less than 180 mg/dL (1-2 hours) ?     Critically ill patients:  140 - 180 mg/dL  ? ?Lab Results  ?Component Value Date  ? GLUCAP 174 (H) 08/29/2019  ? HGBA1C 6.6 (H) 02/08/2022  ? ? ?Review of Glycemic Control ? ?Diabetes history: DM ?Outpatient Diabetes medications: none ?Current orders for Inpatient glycemic control: none ? ?Spoke with patient at bedside.  A1C 6.6%.  Has history of GDM with previous pregnancy and was diet controlled. Likely DM2.   After discussing importance of optimal glucose control during pregnancy to avoid long and short term complications for her and the fetus, she states, "I am not keeping this baby.  I have an appointment coming up".  This DM coordinator apologized for taking her time.  May benefit from Metformin at DC.  She states she tried to take it with prior pregnancy but could not tolerate the GI side effects.  Advised if prescribed in the future to take it with a meal to help with GI issues.   ? ?Will continue to follow while inpatient. ? ?Thank you, ?Reche Dixon, MSN, RN ?Diabetes Coordinator ?Inpatient Diabetes Program ?[phone] (team pager from 8a-5p) ? ? ? ? ? ?

## 2022-02-10 ENCOUNTER — Encounter (HOSPITAL_COMMUNITY): Payer: Self-pay | Admitting: Family Medicine

## 2022-02-10 LAB — COMPREHENSIVE METABOLIC PANEL
ALT: 11 U/L (ref 0–44)
AST: 14 U/L — ABNORMAL LOW (ref 15–41)
Albumin: 3.2 g/dL — ABNORMAL LOW (ref 3.5–5.0)
Alkaline Phosphatase: 48 U/L (ref 38–126)
Anion gap: 7 (ref 5–15)
BUN: <5 mg/dL — ABNORMAL LOW (ref 6–20)
CO2: 22 mmol/L (ref 22–32)
Calcium: 8.7 mg/dL — ABNORMAL LOW (ref 8.9–10.3)
Chloride: 105 mmol/L (ref 98–111)
Creatinine, Ser: 0.76 mg/dL (ref 0.44–1.00)
GFR, Estimated: >60 mL/min (ref >60)
Glucose, Bld: 154 mg/dL — ABNORMAL HIGH (ref 70–99)
Potassium: 3.5 mmol/L (ref 3.5–5.1)
Sodium: 134 mmol/L — ABNORMAL LOW (ref 135–145)
Total Bilirubin: 1 mg/dL (ref 0.3–1.2)
Total Protein: 5.9 g/dL — ABNORMAL LOW (ref 6.5–8.1)

## 2022-02-10 MED ORDER — SCOPOLAMINE 1 MG/3DAYS TD PT72: 1.0000 | MEDICATED_PATCH | TRANSDERMAL | 1 refills | Status: DC

## 2022-02-10 NOTE — Progress Notes (Signed)
Patient provided discharge instructions. Patient states understanding with no further questions. Note provided to patient for school. Patient states that she was able to keep her food and fluids down this AM. ?

## 2022-02-10 NOTE — Discharge Summary (Signed)
Patient ID: ?Tara Nguyen ?MRN: [id number] ?DOB/AGE: Oct 12, 1996 25 y.o. ? ?Admit date: 02/08/2022 ?Discharge date: 02/10/2022 ? ?Admission Diagnoses:    Hyperemesis, IUP 8 weeks ? ?Discharge Diagnoses: SAA , undelivered ? ?Prenatal Procedures: ultrasound ? ?Consults: NA ? ?Hospital Course:  ?This is a 26 y.o. [id number] with IUP at [redacted]w[redacted]d admitted for hyperemesis. She was placed on antiemetic medications and IV fluids. Electrolytes were replete.  She responded well and was able to tolerate a regular diet.  Pt reports she plans to terminate pregnancy and already has made arrangements for this, thus no follow up appt was made for the pt.  She was deemed stable for discharge to home with outpatient follow up. ? ?Discharge Exam: ?Temp:  [98.2 ?F (36.8 ?C)-99.3 ?F (37.4 ?C)] 99.1 ?F (37.3 ?C) (04/08 7062) ?Pulse Rate:  [74-83] 75 (04/08 0835) ?Resp:  [15-16] 16 (04/08 0835) ?BP: (102-121)/(52-98) 102/63 (04/08 3762) ?SpO2:  [99 %-100 %] 100 % (04/08 0835) ?Weight:  [74.4 kg] 74.4 kg (04/08 0550) ?Physical Examination: ?CONSTITUTIONAL: Well-developed, well-nourished female in no acute distress.  ?HENT:  Normocephalic, atraumatic, External right and left ear normal. Oropharynx is clear and moist ?EYES: Conjunctivae and EOM are normal. Pupils are equal, round, and reactive to light. No scleral icterus.  ?NECK: Normal range of motion, supple, no masses ?SKIN: Skin is warm and dry. No rash noted. Not diaphoretic. No erythema. No pallor. ?NEUROLGIC: Alert and oriented to person, place, and time. Normal reflexes, muscle tone coordination. No cranial nerve deficit noted. ?PSYCHIATRIC: Normal mood and affect. Normal behavior. Normal judgment and thought content. ?CARDIOVASCULAR: Normal heart rate noted, regular rhythm ?RESPIRATORY: Effort and breath sounds normal, no problems with respiration noted ?MUSCULOSKELETAL: Normal range of motion. No edema and no tenderness. 2+ distal pulses. ?ABDOMEN: Soft, nontender, nondistended,  gravid. ?CERVIX:   ? ?Fetal monitoring: + FHT's ? ?Significant Diagnostic Studies:  ?Results for orders placed or performed during the hospital encounter of 02/08/22 (from the past 168 hour(s))  ?Comprehensive metabolic panel  ? Collection Time: 02/08/22  9:10 PM  ?Result Value Ref Range  ? Sodium 137 135 - 145 mmol/L  ? Potassium 2.6 (LL) 3.5 - 5.1 mmol/L  ? Chloride 102 98 - 111 mmol/L  ? CO2 22 22 - 32 mmol/L  ? Glucose, Bld 102 (H) 70 - 99 mg/dL  ? BUN <5 (L) 6 - 20 mg/dL  ? Creatinine, Ser 0.82 0.44 - 1.00 mg/dL  ? Calcium 9.1 8.9 - 10.3 mg/dL  ? Total Protein 7.1 6.5 - 8.1 g/dL  ? Albumin 4.0 3.5 - 5.0 g/dL  ? AST 15 15 - 41 U/L  ? ALT 15 0 - 44 U/L  ? Alkaline Phosphatase 56 38 - 126 U/L  ? Total Bilirubin 0.9 0.3 - 1.2 mg/dL  ? GFR, Estimated >60 >60 mL/min  ? Anion gap 13 5 - 15  ?Hemoglobin A1c  ? Collection Time: 02/08/22  9:10 PM  ?Result Value Ref Range  ? Hgb A1c MFr Bld 6.6 (H) 4.8 - 5.6 %  ? Mean Plasma Glucose 142.72 mg/dL  ?TSH  ? Collection Time: 02/08/22  9:10 PM  ?Result Value Ref Range  ? TSH 0.936 0.350 - 4.500 uIU/mL  ?CBC with Differential/Platelet  ? Collection Time: 02/08/22  9:10 PM  ?Result Value Ref Range  ? WBC 16.4 (H) 4.0 - 10.5 K/uL  ? RBC 5.24 (H) 3.87 - 5.11 MIL/uL  ? Hemoglobin 13.5 12.0 - 15.0 g/dL  ? HCT 40.4 36.0 - 46.0 %  ?  MCV 77.1 (L) 80.0 - 100.0 fL  ? MCH 25.8 (L) 26.0 - 34.0 pg  ? MCHC 33.4 30.0 - 36.0 g/dL  ? RDW 14.6 11.5 - 15.5 %  ? Platelets 370 150 - 400 K/uL  ? nRBC 0.0 0.0 - 0.2 %  ? Neutrophils Relative % 82 %  ? Neutro Abs 13.5 (H) 1.7 - 7.7 K/uL  ? Lymphocytes Relative 12 %  ? Lymphs Abs 2.0 0.7 - 4.0 K/uL  ? Monocytes Relative 4 %  ? Monocytes Absolute 0.7 0.1 - 1.0 K/uL  ? Eosinophils Relative 1 %  ? Eosinophils Absolute 0.1 0.0 - 0.5 K/uL  ? Basophils Relative 0 %  ? Basophils Absolute 0.0 0.0 - 0.1 K/uL  ? Immature Granulocytes 1 %  ? Abs Immature Granulocytes 0.09 (H) 0.00 - 0.07 K/uL  ?Lactic acid, plasma  ? Collection Time: 02/08/22  9:18 PM  ?Result  Value Ref Range  ? Lactic Acid, Venous 1.4 0.5 - 1.9 mmol/L  ?Comprehensive metabolic panel  ? Collection Time: 02/09/22  5:20 AM  ?Result Value Ref Range  ? Sodium 136 135 - 145 mmol/L  ? Potassium 3.2 (L) 3.5 - 5.1 mmol/L  ? Chloride 106 98 - 111 mmol/L  ? CO2 24 22 - 32 mmol/L  ? Glucose, Bld 68 (L) 70 - 99 mg/dL  ? BUN <5 (L) 6 - 20 mg/dL  ? Creatinine, Ser 0.76 0.44 - 1.00 mg/dL  ? Calcium 8.5 (L) 8.9 - 10.3 mg/dL  ? Total Protein 5.8 (L) 6.5 - 8.1 g/dL  ? Albumin 3.0 (L) 3.5 - 5.0 g/dL  ? AST 10 (L) 15 - 41 U/L  ? ALT 11 0 - 44 U/L  ? Alkaline Phosphatase 43 38 - 126 U/L  ? Total Bilirubin 1.1 0.3 - 1.2 mg/dL  ? GFR, Estimated >60 >60 mL/min  ? Anion gap 6 5 - 15  ?Comprehensive metabolic panel  ? Collection Time: 02/10/22  5:22 AM  ?Result Value Ref Range  ? Sodium 134 (L) 135 - 145 mmol/L  ? Potassium 3.5 3.5 - 5.1 mmol/L  ? Chloride 105 98 - 111 mmol/L  ? CO2 22 22 - 32 mmol/L  ? Glucose, Bld 154 (H) 70 - 99 mg/dL  ? BUN <5 (L) 6 - 20 mg/dL  ? Creatinine, Ser 0.76 0.44 - 1.00 mg/dL  ? Calcium 8.7 (L) 8.9 - 10.3 mg/dL  ? Total Protein 5.9 (L) 6.5 - 8.1 g/dL  ? Albumin 3.2 (L) 3.5 - 5.0 g/dL  ? AST 14 (L) 15 - 41 U/L  ? ALT 11 0 - 44 U/L  ? Alkaline Phosphatase 48 38 - 126 U/L  ? Total Bilirubin 1.0 0.3 - 1.2 mg/dL  ? GFR, Estimated >60 >60 mL/min  ? Anion gap 7 5 - 15  ?Results for orders placed or performed during the hospital encounter of 02/07/22 (from the past 168 hour(s))  ?Pregnancy, urine POC  ? Collection Time: 02/07/22  9:01 AM  ?Result Value Ref Range  ? Preg Test, Ur POSITIVE (A) NEGATIVE  ?Urinalysis, Routine w reflex microscopic Urine, Clean Catch  ? Collection Time: 02/07/22  9:14 AM  ?Result Value Ref Range  ? Color, Urine AMBER (A) YELLOW  ? APPearance HAZY (A) CLEAR  ? Specific Gravity, Urine 1.027 1.005 - 1.030  ? pH 5.0 5.0 - 8.0  ? Glucose, UA NEGATIVE NEGATIVE mg/dL  ? Hgb urine dipstick SMALL (A) NEGATIVE  ? Bilirubin Urine NEGATIVE NEGATIVE  ? Ketones,  ur 80 (A) NEGATIVE mg/dL  ?  Protein, ur 100 (A) NEGATIVE mg/dL  ? Nitrite NEGATIVE NEGATIVE  ? Leukocytes,Ua NEGATIVE NEGATIVE  ? RBC / HPF 0-5 0 - 5 RBC/hpf  ? WBC, UA 0-5 0 - 5 WBC/hpf  ? Bacteria, UA FEW (A) NONE SEEN  ? Squamous Epithelial / LPF 0-5 0 - 5  ? Mucus PRESENT   ?GC/Chlamydia probe amp ([place])not at [facility]  ? Collection Time: 02/07/22  9:42 AM  ?Result Value Ref Range  ? Neisseria Gonorrhea Negative   ? Chlamydia Negative   ? Comment Normal Reference Ranger Chlamydia - Negative   ? Comment    ?  Normal Reference Range Neisseria Gonorrhea - Negative  ?Wet prep, genital  ? Collection Time: 02/07/22 10:08 AM  ?Result Value Ref Range  ? Yeast Wet Prep HPF POC NONE SEEN NONE SEEN  ? Trich, Wet Prep NONE SEEN NONE SEEN  ? Clue Cells Wet Prep HPF POC NONE SEEN NONE SEEN  ? WBC, Wet Prep HPF POC >=10 (A) <10  ? Sperm NONE SEEN   ?HIV Antibody (routine testing w rflx)  ? Collection Time: 02/07/22 10:08 AM  ?Result Value Ref Range  ? HIV Screen 4th Generation wRfx Non Reactive Non Reactive  ?CBC  ? Collection Time: 02/07/22 10:08 AM  ?Result Value Ref Range  ? WBC 17.1 (H) 4.0 - 10.5 K/uL  ? RBC 6.03 (H) 3.87 - 5.11 MIL/uL  ? Hemoglobin 15.3 (H) 12.0 - 15.0 g/dL  ? HCT 46.4 (H) 36.0 - 46.0 %  ? MCV 76.9 (L) 80.0 - 100.0 fL  ? MCH 25.4 (L) 26.0 - 34.0 pg  ? MCHC 33.0 30.0 - 36.0 g/dL  ? RDW 14.7 11.5 - 15.5 %  ? Platelets 470 (H) 150 - 400 K/uL  ? nRBC 0.0 0.0 - 0.2 %  ?hCG, quantitative, pregnancy  ? Collection Time: 02/07/22 10:08 AM  ?Result Value Ref Range  ? hCG, Beta Chain, Quant, S 103,398 (H) <5 mIU/mL  ?Comprehensive metabolic panel  ? Collection Time: 02/07/22 10:08 AM  ?Result Value Ref Range  ? Sodium 136 135 - 145 mmol/L  ? Potassium 3.0 (L) 3.5 - 5.1 mmol/L  ? Chloride 100 98 - 111 mmol/L  ? CO2 19 (L) 22 - 32 mmol/L  ? Glucose, Bld 139 (H) 70 - 99 mg/dL  ? BUN 10 6 - 20 mg/dL  ? Creatinine, Ser 0.89 0.44 - 1.00 mg/dL  ? Calcium 10.1 8.9 - 10.3 mg/dL  ? Total Protein 8.5 (H) 6.5 - 8.1 g/dL  ? Albumin 4.6 3.5 - 5.0 g/dL   ? AST 17 15 - 41 U/L  ? ALT 17 0 - 44 U/L  ? Alkaline Phosphatase 66 38 - 126 U/L  ? Total Bilirubin 1.2 0.3 - 1.2 mg/dL  ? GFR, Estimated >60 >60 mL/min  ? Anion gap 17 (H) 5 - 15  ? ? ?Discharge Condition: Stable ? ?

## 2022-02-10 NOTE — Plan of Care (Signed)
?  Problem: Education: ?Goal: Knowledge of General Education information will improve ?Description: Including pain rating scale, medication(s)/side effects and non-pharmacologic comfort measures ?Outcome: Completed/Met ?  ?Problem: Health Behavior/Discharge Planning: ?Goal: Ability to manage health-related needs will improve ?Outcome: Completed/Met ?  ?Problem: Clinical Measurements: ?Goal: Ability to maintain clinical measurements within normal limits will improve ?Outcome: Completed/Met ?Goal: Will remain free from infection ?Outcome: Completed/Met ?Goal: Diagnostic test results will improve ?Outcome: Completed/Met ?Goal: Respiratory complications will improve ?Outcome: Completed/Met ?Goal: Cardiovascular complication will be avoided ?Outcome: Completed/Met ?  ?Problem: Activity: ?Goal: Risk for activity intolerance will decrease ?Outcome: Completed/Met ?  ?Problem: Nutrition: ?Goal: Adequate nutrition will be maintained ?Outcome: Completed/Met ?  ?Problem: Coping: ?Goal: Level of anxiety will decrease ?Outcome: Completed/Met ?  ?Problem: Elimination: ?Goal: Will not experience complications related to bowel motility ?Outcome: Completed/Met ?Goal: Will not experience complications related to urinary retention ?Outcome: Completed/Met ?  ?Problem: Pain Managment: ?Goal: General experience of comfort will improve ?Outcome: Completed/Met ?  ?Problem: Safety: ?Goal: Ability to remain free from injury will improve ?Outcome: Completed/Met ?  ?Problem: Skin Integrity: ?Goal: Risk for impaired skin integrity will decrease ?Outcome: Completed/Met ?  ?Problem: Education: ?Goal: Knowledge of disease or condition will improve ?Outcome: Completed/Met ?Goal: Knowledge of the prescribed therapeutic regimen will improve ?Outcome: Completed/Met ?Goal: Individualized Educational Video(s) ?Outcome: Completed/Met ?  ?Problem: Clinical Measurements: ?Goal: Complications related to the disease process, condition or treatment will be  avoided or minimized ?Outcome: Completed/Met ?  ?

## 2022-02-19 ENCOUNTER — Encounter: Payer: Self-pay | Admitting: Obstetrics and Gynecology

## 2022-02-19 DIAGNOSIS — E119 Type 2 diabetes mellitus without complications: Secondary | ICD-10-CM | POA: Insufficient documentation

## 2022-04-10 ENCOUNTER — Emergency Department (HOSPITAL_COMMUNITY): Payer: Medicaid Other

## 2022-04-10 ENCOUNTER — Other Ambulatory Visit: Payer: Self-pay

## 2022-04-10 ENCOUNTER — Encounter (HOSPITAL_COMMUNITY): Payer: Self-pay | Admitting: Emergency Medicine

## 2022-04-10 ENCOUNTER — Emergency Department (HOSPITAL_COMMUNITY)
Admission: EM | Admit: 2022-04-10 | Discharge: 2022-04-11 | Discharge status: Against Medical Advice | Payer: Medicaid Other | Attending: Emergency Medicine | Admitting: Emergency Medicine

## 2022-04-10 DIAGNOSIS — Z5321 Procedure and treatment not carried out due to patient leaving prior to being seen by health care provider: Secondary | ICD-10-CM | POA: Insufficient documentation

## 2022-04-10 DIAGNOSIS — G501 Atypical facial pain: Secondary | ICD-10-CM | POA: Diagnosis not present

## 2022-04-10 DIAGNOSIS — F329 Major depressive disorder, single episode, unspecified: Secondary | ICD-10-CM | POA: Diagnosis present

## 2022-04-10 DIAGNOSIS — R1084 Generalized abdominal pain: Secondary | ICD-10-CM | POA: Diagnosis not present

## 2022-04-10 DIAGNOSIS — R519 Headache, unspecified: Secondary | ICD-10-CM | POA: Diagnosis not present
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
## 2022-05-08 ENCOUNTER — Other Ambulatory Visit: Payer: Self-pay

## 2022-05-08 ENCOUNTER — Encounter (HOSPITAL_COMMUNITY): Payer: Self-pay | Admitting: Emergency Medicine

## 2022-05-08 ENCOUNTER — Emergency Department (HOSPITAL_COMMUNITY)
Admission: EM | Admit: 2022-05-08 | Discharge: 2022-05-08 | Disposition: A | Discharge status: Home / Self Care | Payer: Medicaid Other | Attending: Emergency Medicine | Admitting: Emergency Medicine

## 2022-05-08 ENCOUNTER — Emergency Department (HOSPITAL_COMMUNITY): Payer: Medicaid Other

## 2022-05-08 DIAGNOSIS — J069 Acute upper respiratory infection, unspecified: Secondary | ICD-10-CM | POA: Diagnosis not present

## 2022-05-08 DIAGNOSIS — J029 Acute pharyngitis, unspecified: Secondary | ICD-10-CM | POA: Insufficient documentation

## 2022-05-08 DIAGNOSIS — R000 Tachycardia, unspecified: Secondary | ICD-10-CM | POA: Diagnosis not present

## 2022-05-08 DIAGNOSIS — J45909 Unspecified asthma, uncomplicated: Secondary | ICD-10-CM | POA: Diagnosis not present

## 2022-05-08 DIAGNOSIS — Z20822 Contact with and (suspected) exposure to covid-19: Secondary | ICD-10-CM | POA: Diagnosis not present

## 2022-05-08 DIAGNOSIS — R0789 Other chest pain: Secondary | ICD-10-CM | POA: Diagnosis present

## 2022-05-08 LAB — SARS CORONAVIRUS 2 BY RT PCR: SARS Coronavirus 2 by RT PCR: NEGATIVE

## 2022-05-08 MED ORDER — IPRATROPIUM-ALBUTEROL 0.5-2.5 (3) MG/3ML IN SOLN
3.0000 mL | Freq: Once | RESPIRATORY_TRACT | Status: AC
  Administered 2022-05-08: 3 mL via RESPIRATORY_TRACT
  Filled 2022-05-08: qty 3

## 2022-05-08 MED ORDER — ALBUTEROL SULFATE HFA 108 (90 BASE) MCG/ACT IN AERS
2.0000 | INHALATION_SPRAY | RESPIRATORY_TRACT | Status: DC | PRN
Start: 2022-05-08 — End: 2022-05-08
  Administered 2022-05-08: 2 via RESPIRATORY_TRACT
  Filled 2022-05-08: qty 6.7

## 2022-05-08 MED ORDER — OXYCODONE-ACETAMINOPHEN 5-325 MG PO TABS: 1.0000 | ORAL_TABLET | Freq: Three times a day (TID) | ORAL | 0 refills | Status: DC | PRN

## 2022-05-08 MED ORDER — DEXAMETHASONE 4 MG PO TABS
10.0000 mg | ORAL_TABLET | Freq: Once | ORAL | Status: AC
  Administered 2022-05-08: 10 mg via ORAL
  Filled 2022-05-08: qty 3

## 2022-05-08 NOTE — ED Provider Triage Note (Signed)
  Emergency Medicine Provider Triage Evaluation Note  MRN:  [id number]  Arrival date & time: 05/08/22    Medically screening exam initiated at 5:52 AM.   CC:   Wheezing  HPI:  Tara Nguyen is a 26 y.o. year-old female presents to the ED with chief complaint of wheezing and chest tightness.  Has hx of asthma.  Reports increased nasal discharge and sore throat.  Denies fever.  States that she has tried her inhaler without relief.  History provided by History provided by patient. ROS:  -As included in HPI PE:   Vitals:   05/08/22 0545  BP: (!) 123/97  Pulse: (!) 102  Resp: 16  Temp: 97.8 F (36.6 C)  SpO2: 98%    Non-toxic appearing No respiratory distress, mild wheezing rhinorrhea MDM:  Based on signs and symptoms, URI is highest on my differential, followed by covid. I've ordered neb in triage to expedite lab/diagnostic workup.  Patient was informed that the remainder of the evaluation will be completed by another provider, this initial triage assessment does not replace that evaluation, and the importance of remaining in the ED until their evaluation is complete.    Roxy Horseman, PA-C 05/08/22 [phone]

## 2022-05-08 NOTE — ED Triage Notes (Signed)
Pt reported to ED with c/o chest heaviness, sore throat and shortness of breath. States symptoms have been ongoing for past few days and have worsened today. Has hx of asthma and states she has used rescue inhaler with little to no relief in symptoms.

## 2022-05-08 NOTE — ED Notes (Signed)
Pt verbalizes understanding of discharge instructions. Opportunity for questions and answers were provided. Pt discharged from the ED.   ?

## 2022-05-08 NOTE — ED Provider Notes (Signed)
[facility] EMERGENCY DEPARTMENT Provider Note   CSN: [id number] Arrival date & time: 05/08/22  4098     History  No chief complaint on file.   Tara Nguyen is a 26 y.o. female.  HPI Patient presents with chest heaviness sore throat and wheezing.  History of asthma.  States inhaler has not helped.  States hurts to swallow.  No fevers or chills.  Does feel little more short of breath.  No known sick contacts.  Occasional cough.   Past Medical History:  Diagnosis Date   GDM (gestational diabetes mellitus) 07/2019   Hx of preeclampsia, prior pregnancy, currently pregnant 2020   Hx of trichomoniasis     Home Medications Prior to Admission medications   Medication Sig Start Date End Date Taking? Authorizing Provider  oxyCODONE-acetaminophen (PERCOCET/ROXICET) 5-325 MG tablet Take 1 tablet by mouth every 8 (eight) hours as needed for severe pain. 05/08/22  Yes Benjiman Core, MD  Dextrose-Fructose-Sod Citrate Loletha Carrow) [id number] MG CHEW Chew 1 tablet by mouth 2 (two) times daily as needed (nausea).    [provider]  ondansetron (ZOFRAN-ODT) 4 MG disintegrating tablet Take 1 tablet (4 mg total) by mouth every 8 (eight) hours as needed for nausea or vomiting. Patient not taking: Reported on 02/09/2022 02/07/22   Raelyn Mora, CNM  promethazine (PHENERGAN) 25 MG tablet Take 1 tablet (25 mg total) by mouth every 6 (six) hours as needed for nausea or vomiting. Patient not taking: Reported on 02/09/2022 02/07/22   Raelyn Mora, CNM  scopolamine (TRANSDERM-SCOP) 1 MG/3DAYS Place 1 patch (1.5 mg total) onto the skin every 3 (three) days. 02/11/22   Hermina Staggers, MD  VENTOLIN HFA 108 (90 Base) MCG/ACT inhaler Inhale 2 puffs into the lungs every 6 (six) hours as needed for nausea/vomiting. 12/22/21   [provider]      Allergies    Patient has no known allergies.    Review of Systems   Review of Systems  Physical Exam Updated Vital Signs BP  (!) 129/95 (BP Location: Right Arm)   Pulse 100   Temp 98.6 F (37 C) (Oral)   Resp 16   LMP 01/08/2022 (Approximate)   SpO2 97%  Physical Exam HENT:     Head: Normocephalic.     Mouth/Throat:     Comments: Minimal posterior pharyngeal erythema without exudate.  Uvula midline. Neck:     Comments: Few scattered cervical lymph nodes. Pulmonary:     Breath sounds: No wheezing or rhonchi.  Abdominal:     Tenderness: There is no abdominal tenderness.  Musculoskeletal:        General: No tenderness.     Cervical back: Neck supple. No tenderness.  Skin:    Capillary Refill: Capillary refill takes less than 2 seconds.  Neurological:     Mental Status: She is alert and oriented to person, place, and time.     ED Results / Procedures / Treatments   Labs (all labs ordered are listed, but only abnormal results are displayed) Labs Reviewed  SARS CORONAVIRUS 2 BY RT PCR    EKG EKG Interpretation  Date/Time:  Tuesday May 08 2022 05:48:48 EDT Ventricular Rate:  101 PR Interval:  142 QRS Duration: 86 QT Interval:  354 QTC Calculation: 459 R Axis:   72 Text Interpretation: Sinus tachycardia Nonspecific T wave abnormality Abnormal ECG No previous ECGs available Confirmed by Benjiman Core [phone]) on 05/08/2022 11:01:47 AM  Radiology DG Chest Portable 1 View  Result Date: 05/08/2022  CLINICAL DATA:  Chest heaviness, sore throat and shortness of breath. EXAM: PORTABLE CHEST 1 VIEW COMPARISON:  None Available. FINDINGS: Heart size and mediastinal contours are within normal limits. Lungs are clear. No pleural effusion or pneumothorax is seen. Osseous structures about the chest are unremarkable. IMPRESSION: No active disease. Electronically Signed   By: Bary Richard M.D.   On: 05/08/2022 10:30    Procedures Procedures    Medications Ordered in ED Medications  albuterol (VENTOLIN HFA) 108 (90 Base) MCG/ACT inhaler 2 puff (2 puffs Inhalation Given 05/08/22 0601)  ipratropium-albuterol  (DUONEB) 0.5-2.5 (3) MG/3ML nebulizer solution 3 mL (3 mLs Nebulization Given 05/08/22 0559)  dexamethasone (DECADRON) tablet 10 mg (10 mg Oral Given 05/08/22 6962)    ED Course/ Medical Decision Making/ A&P                           Medical Decision Making Amount and/or Complexity of Data Reviewed Radiology: ordered.  Risk Prescription drug management.   Patient with URI symptoms and cough but also sore throat.  Posterior pharyngeal is rather benign exam.  Doubt strep.  No exudate.  Has no signs of peritonsillar abscess.  Likely viral syndrome of some kind.  Chest x-ray done due to shortness of breath with a cough and reassuring.  No pneumonia.  Independently interpreted by me.  Decadron given to help with symptoms.  Also will give short course of pain medicines.  Also may need a numbing drop but appears stable for discharge home.        Final Clinical Impression(s) / ED Diagnoses Final diagnoses:  Upper respiratory tract infection, unspecified type  Pharyngitis, unspecified etiology    Rx / DC Orders ED Discharge Orders          Ordered    oxyCODONE-acetaminophen (PERCOCET/ROXICET) 5-325 MG tablet  Every 8 hours PRN        05/08/22 1109              Benjiman Core, MD 05/08/22 1125

## 2022-05-08 NOTE — Discharge Instructions (Addendum)
A numbing lozenge that over-the-counter such as Sucrets may help with some of the pain also.
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
## 2023-03-05 ENCOUNTER — Encounter (HOSPITAL_COMMUNITY): Payer: Self-pay

## 2023-03-05 ENCOUNTER — Ambulatory Visit (INDEPENDENT_AMBULATORY_CARE_PROVIDER_SITE_OTHER): Payer: Medicaid Other | Admitting: Clinical

## 2023-03-05 DIAGNOSIS — F331 Major depressive disorder, recurrent, moderate: Secondary | ICD-10-CM

## 2023-03-05 DIAGNOSIS — F1220 Cannabis dependence, uncomplicated: Secondary | ICD-10-CM
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
## 2023-04-09 ENCOUNTER — Ambulatory Visit (HOSPITAL_COMMUNITY): Payer: Medicaid Other | Admitting: Clinical

## 2023-04-29 ENCOUNTER — Ambulatory Visit (INDEPENDENT_AMBULATORY_CARE_PROVIDER_SITE_OTHER): Payer: Medicaid Other | Admitting: Clinical

## 2023-04-29 DIAGNOSIS — F331 Major depressive disorder, recurrent, moderate: Secondary | ICD-10-CM
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
## 2023-05-06 ENCOUNTER — Encounter (HOSPITAL_COMMUNITY): Payer: Self-pay

## 2023-05-06 ENCOUNTER — Ambulatory Visit (HOSPITAL_COMMUNITY)
Admission: EM | Admit: 2023-05-06 | Discharge: 2023-05-06 | Disposition: A | Discharge status: Home / Self Care | Payer: Medicaid Other | Attending: Internal Medicine | Admitting: Internal Medicine

## 2023-05-06 DIAGNOSIS — N730 Acute parametritis and pelvic cellulitis: Secondary | ICD-10-CM | POA: Diagnosis present

## 2023-05-06 LAB — POCT URINE PREGNANCY: Preg Test, Ur: NEGATIVE

## 2023-05-06 MED ORDER — METRONIDAZOLE 500 MG PO TABS: 500.0000 mg | ORAL_TABLET | Freq: Two times a day (BID) | ORAL | 0 refills | Status: AC

## 2023-05-06 MED ORDER — LIDOCAINE HCL (PF) 1 % IJ SOLN
INTRAMUSCULAR | Status: AC
  Filled 2023-05-06: qty 2

## 2023-05-06 MED ORDER — DOXYCYCLINE HYCLATE 100 MG PO CAPS: 100.0000 mg | ORAL_CAPSULE | Freq: Two times a day (BID) | ORAL | 0 refills | Status: AC

## 2023-05-06 MED ORDER — CEFTRIAXONE SODIUM 1 G IJ SOLR
1.0000 g | Freq: Once | INTRAMUSCULAR | Status: AC
  Administered 2023-05-06: 1 g via INTRAMUSCULAR

## 2023-05-06 MED ORDER — CEFTRIAXONE SODIUM 1 G IJ SOLR
INTRAMUSCULAR | Status: AC
  Filled 2023-05-06: qty 10

## 2023-05-06 NOTE — ED Triage Notes (Signed)
Pt states had a positive pregnancy test 2 days ago. States has a vaginal discharge with odor for past 2 weeks. States was tx'd for BV with no relief.

## 2023-05-06 NOTE — Discharge Instructions (Addendum)
Your pregnancy test is negative Please take medications as directed Please avoid sexual intercourse for at least 7 days to allow the medications to work Please follow-up with your primary care doctor to further evaluate nipple discharge We will call you with recommendations if labs are abnormal Please avoid drinking alcohol while taking metronidazole because it would make you nauseated Please return to urgent care if you have any other concerns.

## 2023-05-06 NOTE — ED Provider Notes (Addendum)
[facility]    CSN: [id number] Arrival date & time: 05/06/23  4098      History   Chief Complaint Chief Complaint  Patient presents with   SEXUALLY TRANSMITTED DISEASE    HPI Tara Nguyen is a 27 y.o. female comes to the urgent care with 2-week history of yellowish vaginal discharge.  Symptoms started insidiously and has been persistent.  Over the past few days patient has noticed lower abdominal pain.  She denies any dysuria urgency or frequency.  Pain is constant, throbbing and of moderate severity.  No radiation of pain.  No diarrhea.  Patient denies any dysuria, urgency or frequency.  Patient is sexually active with multiple sexual partners.  She has both female and female sexual partners.  She is engaged in unprotected sexual intercourse with both partners in the past few weeks.  Last menstrual period ended about a week ago.  She also complains of intermittent epigastric abdominal pain.  Epigastric pain is burning in nature and aggravated by food.  Is associated with nausea but no vomiting.  No known relieving factors.  No history of alcohol use or over-the-counter NSAID use.  HPI  Past Medical History:  Diagnosis Date   GDM (gestational diabetes mellitus) 07/2019   Hx of preeclampsia, prior pregnancy, currently pregnant 2020   Hx of trichomoniasis     Patient Active Problem List   Diagnosis Date Noted   Type 2 diabetes mellitus (HCC) 02/19/2022   Hyperemesis 02/08/2022   Preeclampsia in postpartum period 08/30/2019   Hives 08/30/2019   Gestational diabetes 08/30/2019   Shoulder dystocia during labor and delivery 08/30/2019   SVD (10/22) 08/28/2019   Normal labor 08/27/2019    Past Surgical History:  Procedure Laterality Date   NO PAST SURGERIES      OB History     Gravida  5   Para  1   Term  1   Preterm      AB  3   Living  1      SAB  2   IAB  1   Ectopic      Multiple  0   Live Births  1            Home Medications     Prior to Admission medications   Medication Sig Start Date End Date Taking? Authorizing Provider  doxycycline (VIBRAMYCIN) 100 MG capsule Take 1 capsule (100 mg total) by mouth 2 (two) times daily for 14 days. 05/06/23 05/20/23 Yes Raivyn Kabler, Britta Mccreedy, MD  metroNIDAZOLE (FLAGYL) 500 MG tablet Take 1 tablet (500 mg total) by mouth 2 (two) times daily for 14 days. 05/06/23 05/20/23 Yes Alaria Oconnor, Britta Mccreedy, MD  Dextrose-Fructose-Sod Citrate Loletha Carrow) [phone] MG CHEW Chew 1 tablet by mouth 2 (two) times daily as needed (nausea).    [provider]  VENTOLIN HFA 108 (90 Base) MCG/ACT inhaler Inhale 2 puffs into the lungs every 6 (six) hours as needed for nausea/vomiting. 12/22/21   [provider]    Family History History reviewed. No pertinent family history.  Social History Social History   Tobacco Use   Smoking status: Never   Smokeless tobacco: Never  Substance Use Topics   Alcohol use: Never   Drug use: Yes    Types: Marijuana    Comment: last used 02/06/2022     Allergies   Patient has no known allergies.   Review of Systems Review of Systems As per HPI  Physical Exam Triage Vital Signs  ED Triage Vitals  Enc Vitals Group     BP 05/06/23 0935 124/86     Pulse Rate 05/06/23 0935 78     Resp 05/06/23 0935 18     Temp 05/06/23 0935 98.9 F (37.2 C)     Temp Source 05/06/23 0935 Oral     SpO2 05/06/23 0935 98 %     Weight --      Height --      Head Circumference --      Peak Flow --      Pain Score 05/06/23 0936 0     Pain Loc --      Pain Edu? --      Excl. in GC? --    No data found.  Updated Vital Signs BP 124/86 (BP Location: Left Arm)   Pulse 78   Temp 98.9 F (37.2 C) (Oral)   Resp 18   LMP 04/29/2023   SpO2 98%   Breastfeeding No   Visual Acuity Right Eye Distance:   Left Eye Distance:   Bilateral Distance:    Right Eye Near:   Left Eye Near:    Bilateral Near:     Physical Exam Vitals and nursing note reviewed. Exam  conducted with a chaperone present.  Constitutional:      General: She is not in acute distress.    Appearance: She is not ill-appearing.  Cardiovascular:     Rate and Rhythm: Normal rate and regular rhythm.  Pulmonary:     Effort: Pulmonary effort is normal.     Breath sounds: Normal breath sounds.  Abdominal:     General: Abdomen is flat.     Palpations: Abdomen is soft.     Tenderness: There is abdominal tenderness.     Comments: Tenderness mainly in the lower abdomen.  Genitourinary:    General: Normal vulva.     Comments: Speculum exam significant for yellowish discharge in the vaginal vault.  Cervical motion tenderness was present. Musculoskeletal:        General: Normal range of motion.  Neurological:     Mental Status: She is alert.      UC Treatments / Results  Labs (all labs ordered are listed, but only abnormal results are displayed) Labs Reviewed  POCT URINE PREGNANCY  CERVICOVAGINAL ANCILLARY ONLY    EKG   Radiology No results found.  Procedures Procedures (including critical care time)  Medications Ordered in UC Medications  cefTRIAXone (ROCEPHIN) injection 1 g (1 g Intramuscular Given 05/06/23 1035)    Initial Impression / Assessment and Plan / UC Course  I have reviewed the triage vital signs and the nursing notes.  Pertinent labs & imaging results that were available during my care of the patient were reviewed by me and considered in my medical decision making (see chart for details).     1.  Pelvic inflammatory disease: Urine pregnancy test is negative Cervicovaginal swab for GC/chlamydia/trichomonas Ceftriaxone 1 g IM Metronidazole 500 mg twice daily for 14 days Doxycycline 500 mg twice daily for 14 days Safe sex practices advised Return precautions given.  2.  Dyspepsia: Over-the-counter famotidine 20 mg as needed for abdominal pain. Final Clinical Impressions(s) / UC Diagnoses   Final diagnoses:  PID (acute pelvic inflammatory  disease)     Discharge Instructions      Your pregnancy test is negative Please take medications as directed Please avoid sexual intercourse for at least 7 days to allow the medications to work Please follow-up with  your primary care doctor to further evaluate nipple discharge We will call you with recommendations if labs are abnormal Please avoid drinking alcohol while taking metronidazole because it would make you nauseated Please return to urgent care if you have any other concerns.   ED Prescriptions     Medication Sig Dispense Auth. Provider   metroNIDAZOLE (FLAGYL) 500 MG tablet Take 1 tablet (500 mg total) by mouth 2 (two) times daily for 14 days. 28 tablet Phuoc Huy, Britta Mccreedy, MD   doxycycline (VIBRAMYCIN) 100 MG capsule Take 1 capsule (100 mg total) by mouth 2 (two) times daily for 14 days. 28 capsule Aaliya Maultsby, Britta Mccreedy, MD      PDMP not reviewed this encounter.   Merrilee Jansky, MD 05/06/23 1454    Merrilee Jansky, MD 05/06/23 1455

## 2023-05-07 LAB — CERVICOVAGINAL ANCILLARY ONLY
Bacterial Vaginitis (gardnerella): POSITIVE — AB
Candida Glabrata: NEGATIVE
Candida Vaginitis: NEGATIVE
Chlamydia: NEGATIVE
Comment: NEGATIVE
Comment: NEGATIVE
Comment: NEGATIVE
Comment: NEGATIVE
Comment: NEGATIVE
Comment: NORMAL
Neisseria Gonorrhea: NEGATIVE
Trichomonas: NEGATIVE

## 2023-06-03 ENCOUNTER — Ambulatory Visit (HOSPITAL_COMMUNITY): Payer: Medicaid Other | Admitting: Clinical

## 2023-06-18 ENCOUNTER — Ambulatory Visit (HOSPITAL_COMMUNITY): Payer: Medicaid Other | Admitting: Clinical

## 2023-07-02 ENCOUNTER — Ambulatory Visit (HOSPITAL_COMMUNITY): Payer: Medicaid Other | Admitting: Clinical

## 2023-10-22 ENCOUNTER — Encounter (HOSPITAL_COMMUNITY): Payer: Self-pay

## 2023-10-22 ENCOUNTER — Ambulatory Visit (HOSPITAL_COMMUNITY)
Admission: EM | Admit: 2023-10-22 | Discharge: 2023-10-22 | Disposition: A | Discharge status: Home / Self Care | Payer: Medicaid Other | Attending: Family Medicine | Admitting: Family Medicine

## 2023-10-22 DIAGNOSIS — J019 Acute sinusitis, unspecified: Secondary | ICD-10-CM | POA: Diagnosis not present

## 2023-10-22 DIAGNOSIS — Z20828 Contact with and (suspected) exposure to other viral communicable diseases: Secondary | ICD-10-CM | POA: Diagnosis not present

## 2023-10-22 LAB — POCT INFLUENZA A/B
Influenza A, POC: NEGATIVE
Influenza B, POC: NEGATIVE

## 2023-10-22 MED ORDER — CEFDINIR 300 MG PO CAPS: 600.0000 mg | ORAL_CAPSULE | Freq: Every day | ORAL | 0 refills | Status: AC

## 2023-10-22 MED ORDER — BENZONATATE 100 MG PO CAPS: 100.0000 mg | ORAL_CAPSULE | Freq: Three times a day (TID) | ORAL | 0 refills | Status: DC | PRN

## 2023-10-22 NOTE — ED Triage Notes (Signed)
Headache /abd pain/sore throat/productive cough ongoing for some time. Patient was told to go see a specialist for high "prolactin" but has not been called yet. States a lady staying with her was positive for flu and URI.   Patient tried Mucinex this morning with mild relief.

## 2023-10-22 NOTE — ED Provider Notes (Signed)
[facility]    CSN: [id number] Arrival date & time: 10/22/23  8295      History   Chief Complaint Chief Complaint  Patient presents with   Cough   Abdominal Pain    HPI Tara Nguyen is a 27 y.o. female.    Cough Abdominal Pain Associated symptoms: cough   Here for rhinorrhea and nasal congestion that is been going on for about 2 weeks.  She is also had a cough longer than that.  No fever noted at any point, but she has been more tired and may be aching more in the last 2 or 3 days.  She states she was exposed to someone who lives in the same house as she gets had the flu.  No vomiting or diarrhea  She is not allergic any medication  Last menstrual cycle was December 2   She mentions that she has been told she has a high prolactin level and that her primary referred her to a specialist but she has not heard from the specialist.  She has been having some headache but no vision changes  No ear pain   Past Medical History:  Diagnosis Date   GDM (gestational diabetes mellitus) 07/2019   Hx of preeclampsia, prior pregnancy, currently pregnant 2020   Hx of trichomoniasis     Patient Active Problem List   Diagnosis Date Noted   Type 2 diabetes mellitus (HCC) 02/19/2022   Hyperemesis 02/08/2022   Preeclampsia in postpartum period 08/30/2019   Hives 08/30/2019   Gestational diabetes 08/30/2019   Shoulder dystocia during labor and delivery 08/30/2019   SVD (10/22) 08/28/2019   Normal labor 08/27/2019    Past Surgical History:  Procedure Laterality Date   NO PAST SURGERIES      OB History     Gravida  5   Para  1   Term  1   Preterm      AB  3   Living  1      SAB  2   IAB  1   Ectopic      Multiple  0   Live Births  1            Home Medications    Prior to Admission medications   Medication Sig Start Date End Date Taking? Authorizing Provider  benzonatate (TESSALON) 100 MG capsule Take 1 capsule (100 mg total)  by mouth 3 (three) times daily as needed for cough. 10/22/23  Yes Zenia Resides, MD  cefdinir (OMNICEF) 300 MG capsule Take 2 capsules (600 mg total) by mouth daily for 7 days. 10/22/23 10/29/23 Yes Zenia Resides, MD  Dextrose-Fructose-Sod Citrate Loletha Carrow) [phone] MG CHEW Chew 1 tablet by mouth 2 (two) times daily as needed (nausea).    [provider]  norgestimate-ethinyl estradiol (ESTARYLLA) 0.25-35 MG-MCG tablet Take 1 tablet by mouth daily.   Yes [provider]  VENTOLIN HFA 108 (90 Base) MCG/ACT inhaler Inhale 2 puffs into the lungs every 6 (six) hours as needed for nausea/vomiting. 12/22/21   [provider]    Family History History reviewed. No pertinent family history.  Social History Social History   Tobacco Use   Smoking status: Never   Smokeless tobacco: Never  Substance Use Topics   Alcohol use: Never   Drug use: Yes    Types: Marijuana    Comment: last used 02/06/2022     Allergies   Patient has no known allergies.   Review  of Systems Review of Systems  Respiratory:  Positive for cough.   Gastrointestinal:  Positive for abdominal pain.     Physical Exam Triage Vital Signs ED Triage Vitals  Encounter Vitals Group     BP 10/22/23 1020 121/85     Systolic BP Percentile --      Diastolic BP Percentile --      Pulse Rate 10/22/23 1020 74     Resp 10/22/23 1020 16     Temp 10/22/23 1020 98.1 F (36.7 C)     Temp Source 10/22/23 1020 Oral     SpO2 10/22/23 1020 99 %     Weight 10/22/23 1020 145 lb (65.8 kg)     Height 10/22/23 1020 5\' 1"  (1.549 m)     Head Circumference --      Peak Flow --      Pain Score 10/22/23 1018 9     Pain Loc --      Pain Education --      Exclude from Growth Chart --    No data found.  Updated Vital Signs BP 121/85 (BP Location: Left Arm)   Pulse 74   Temp 98.1 F (36.7 C) (Oral)   Resp 16   Ht 5\' 1"  (1.549 m)   Wt 65.8 kg   LMP 10/07/2023 (Approximate)   SpO2 99%   BMI  27.40 kg/m   Visual Acuity Right Eye Distance:   Left Eye Distance:   Bilateral Distance:    Right Eye Near:   Left Eye Near:    Bilateral Near:     Physical Exam Vitals reviewed.  Constitutional:      General: She is not in acute distress.    Appearance: She is not toxic-appearing.  HENT:     Nose: Congestion present.     Mouth/Throat:     Mouth: Mucous membranes are moist.     Pharynx: No oropharyngeal exudate or posterior oropharyngeal erythema.  Eyes:     Extraocular Movements: Extraocular movements intact.     Conjunctiva/sclera: Conjunctivae normal.     Pupils: Pupils are equal, round, and reactive to light.  Cardiovascular:     Rate and Rhythm: Normal rate and regular rhythm.     Heart sounds: No murmur heard. Pulmonary:     Effort: Pulmonary effort is normal. No respiratory distress.     Breath sounds: No stridor. No wheezing, rhonchi or rales.  Musculoskeletal:     Cervical back: Neck supple.  Lymphadenopathy:     Cervical: No cervical adenopathy.  Skin:    Coloration: Skin is not jaundiced or pale.  Neurological:     General: No focal deficit present.     Mental Status: She is alert and oriented to person, place, and time.  Psychiatric:        Behavior: Behavior normal.      UC Treatments / Results  Labs (all labs ordered are listed, but only abnormal results are displayed) Labs Reviewed  POCT INFLUENZA A/B    EKG   Radiology No results found.  Procedures Procedures (including critical care time)  Medications Ordered in UC Medications - No data to display  Initial Impression / Assessment and Plan / UC Course  I have reviewed the triage vital signs and the nursing notes.  Pertinent labs & imaging results that were available during my care of the patient were reviewed by me and considered in my medical decision making (see chart for details).     Test done  since she had been exposed to the flu and she maybe did have more malaise and  myalgia in the last 2 or 3 days.  Flu test is negative.  Omnicef is sent in for probable acute sinusitis and Tessalon Perles are sent in for her cough.  I have asked her to let her primary care know she has not heard from her specialist referral yet. Final Clinical Impressions(s) / UC Diagnoses   Final diagnoses:  Acute sinusitis, recurrence not specified, unspecified location  Exposure to the flu     Discharge Instructions      Your flu test was negative.   Take cefdinir 300 mg--2 capsules together daily for 7 days; this is an antibiotic for possible sinusitis, since you have been sick for 2 weeks with congestion.  Take benzonatate 100 mg, 1 tab every 8 hours as needed for cough.      ED Prescriptions     Medication Sig Dispense Auth. Provider   cefdinir (OMNICEF) 300 MG capsule Take 2 capsules (600 mg total) by mouth daily for 7 days. 14 capsule Edan Serratore, Janace Aris, MD   benzonatate (TESSALON) 100 MG capsule Take 1 capsule (100 mg total) by mouth 3 (three) times daily as needed for cough. 21 capsule Zenia Resides, MD      PDMP not reviewed this encounter.   Zenia Resides, MD 10/22/23 [phone]

## 2023-10-22 NOTE — Discharge Instructions (Addendum)
Your flu test was negative.   Take cefdinir 300 mg--2 capsules together daily for 7 days; this is an antibiotic for possible sinusitis, since you have been sick for 2 weeks with congestion.  Take benzonatate 100 mg, 1 tab every 8 hours as needed for cough.

## 2023-12-09 ENCOUNTER — Encounter (HOSPITAL_COMMUNITY): Payer: Self-pay

## 2023-12-09 ENCOUNTER — Ambulatory Visit (HOSPITAL_COMMUNITY)
Admission: EM | Admit: 2023-12-09 | Discharge: 2023-12-09 | Disposition: A | Discharge status: Home / Self Care | Payer: Medicaid Other | Attending: Physician Assistant | Admitting: Physician Assistant

## 2023-12-09 DIAGNOSIS — Z113 Encounter for screening for infections with a predominantly sexual mode of transmission: Secondary | ICD-10-CM | POA: Diagnosis not present

## 2023-12-09 DIAGNOSIS — L0291 Cutaneous abscess, unspecified: Secondary | ICD-10-CM

## 2023-12-09 LAB — HIV ANTIBODY (ROUTINE TESTING W REFLEX): HIV Screen 4th Generation wRfx: NONREACTIVE

## 2023-12-09 LAB — POCT URINE PREGNANCY: Preg Test, Ur: NEGATIVE

## 2023-12-09 MED ORDER — SULFAMETHOXAZOLE-TRIMETHOPRIM 800-160 MG PO TABS: 1.0000 | ORAL_TABLET | Freq: Two times a day (BID) | ORAL | 0 refills | Status: AC

## 2023-12-09 NOTE — ED Provider Notes (Signed)
[facility]    CSN: [id number] Arrival date & time: 12/09/23  1513      History   Chief Complaint Chief Complaint  Patient presents with   Abscess    HPI Tara Nguyen is a 28 y.o. female.   HPI   She expresses concerns for abscess along cleft  She states there are also bumps along her rectum  She first noticed the bumps about a week ago  She noticed the larger abscess/boil about 2 weeks ago and reports it has been growing in size She thinks it started to drain while she was sitting in waiting room     Past Medical History:  Diagnosis Date   GDM (gestational diabetes mellitus) 07/2019   Hx of preeclampsia, prior pregnancy, currently pregnant 2020   Hx of trichomoniasis     Patient Active Problem List   Diagnosis Date Noted   Type 2 diabetes mellitus (HCC) 02/19/2022   Hyperemesis 02/08/2022   Preeclampsia in postpartum period 08/30/2019   Hives 08/30/2019   Gestational diabetes 08/30/2019   Shoulder dystocia during labor and delivery 08/30/2019   SVD (10/22) 08/28/2019   Normal labor 08/27/2019    Past Surgical History:  Procedure Laterality Date   NO PAST SURGERIES      OB History     Gravida  5   Para  1   Term  1   Preterm      AB  3   Living  1      SAB  2   IAB  1   Ectopic      Multiple  0   Live Births  1            Home Medications    Prior to Admission medications   Medication Sig Start Date End Date Taking? Authorizing Provider  sulfamethoxazole-trimethoprim (BACTRIM DS) 800-160 MG tablet Take 1 tablet by mouth 2 (two) times daily for 5 days. 12/09/23 12/14/23 Yes Estell Puccini E, PA-C  benzonatate (TESSALON) 100 MG capsule Take 1 capsule (100 mg total) by mouth 3 (three) times daily as needed for cough. 10/22/23   Zenia Resides, MD  Dextrose-Fructose-Sod Citrate Loletha Carrow) [phone] MG CHEW Chew 1 tablet by mouth 2 (two) times daily as needed (nausea).    [provider]   norgestimate-ethinyl estradiol (ESTARYLLA) 0.25-35 MG-MCG tablet Take 1 tablet by mouth daily.    [provider]  VENTOLIN HFA 108 (90 Base) MCG/ACT inhaler Inhale 2 puffs into the lungs every 6 (six) hours as needed for nausea/vomiting. 12/22/21   [provider]    Family History History reviewed. No pertinent family history.  Social History Social History   Tobacco Use   Smoking status: Never   Smokeless tobacco: Never  Vaping Use   Vaping status: Never Used  Substance Use Topics   Alcohol use: Never   Drug use: Yes    Types: Marijuana     Allergies   Patient has no known allergies.   Review of Systems Review of Systems  Skin:  Positive for wound.     Physical Exam Triage Vital Signs ED Triage Vitals  Encounter Vitals Group     BP 12/09/23 1712 120/80     Systolic BP Percentile --      Diastolic BP Percentile --      Pulse Rate 12/09/23 1712 80     Resp 12/09/23 1712 14     Temp 12/09/23 1712 98.2 F (36.8 C)  Temp Source 12/09/23 1712 Oral     SpO2 12/09/23 1712 98 %     Weight --      Height --      Head Circumference --      Peak Flow --      Pain Score 12/09/23 1713 8     Pain Loc --      Pain Education --      Exclude from Growth Chart --    No data found.  Updated Vital Signs BP 120/80 (BP Location: Right Arm)   Pulse 80   Temp 98.2 F (36.8 C) (Oral)   Resp 14   SpO2 98%   Visual Acuity Right Eye Distance:   Left Eye Distance:   Bilateral Distance:    Right Eye Near:   Left Eye Near:    Bilateral Near:     Physical Exam Vitals reviewed.  Constitutional:      General: She is awake.     Appearance: Normal appearance. She is well-developed and well-groomed.  HENT:     Head: Normocephalic and atraumatic.  Pulmonary:     Effort: Pulmonary effort is normal.  Genitourinary:      Comments: Pt declines chaperone  Skin:    General: Skin is warm and dry.     Findings: Abscess present.  Neurological:      General: No focal deficit present.     Mental Status: She is alert and oriented to person, place, and time.  Psychiatric:        Mood and Affect: Mood normal.        Behavior: Behavior normal. Behavior is cooperative.        Thought Content: Thought content normal.        Judgment: Judgment normal.      UC Treatments / Results  Labs (all labs ordered are listed, but only abnormal results are displayed) Labs Reviewed  RPR  HIV ANTIBODY (ROUTINE TESTING W REFLEX)  POCT URINE PREGNANCY  CERVICOVAGINAL ANCILLARY ONLY    EKG   Radiology No results found.  Procedures Procedures (including critical care time)  Medications Ordered in UC Medications - No data to display  Initial Impression / Assessment and Plan / UC Course  I have reviewed the triage vital signs and the nursing notes.  Pertinent labs & imaging results that were available during my care of the patient were reviewed by me and considered in my medical decision making (see chart for details).      Final Clinical Impressions(s) / UC Diagnoses   Final diagnoses:  Abscess  Screen for STD (sexually transmitted disease)   Acute, new concern Patient presents with approximately 2 cm in diameter draining abscess along rectal area. As this is already actively draining do not see the need for I&D today. Will send in prescription for Bactrim p.o. twice daily x 5 days.  Recommend warm compresses and sitz bath's to further encourage drainage and resolution.  Reviewed ED return precautions such as fever, increased swelling, increased drainage, increased pain.  Patient is concerned for STDs today we will get cervicovaginal swab as well as HIV and RPR for further rule out.  Results to dictate further management Follow-up as needed    Discharge Instructions      It appears that you have an actively draining abscess along your rectal area.  It is important to allow this to continue to drain further manipulate the area as  this can lead to increased infection.  At this  time I recommend that he use warm compresses and warm sitz bath's to help encourage further drainage.  You can gently wipe the area as needed to remove discharge. I have sent in a medication called Bactrim for you to take twice per day for the next 5 days to assist with resolution. If your symptoms become worse, the area gets larger, starts to produce more significant drainage or bleeding please follow-up or go to the emergency room We will keep you updated on the results of your testing once it is available.  If medications are indicated by your test results those will be sent in to your pharmacy on file.     ED Prescriptions     Medication Sig Dispense Auth. Provider   sulfamethoxazole-trimethoprim (BACTRIM DS) 800-160 MG tablet Take 1 tablet by mouth 2 (two) times daily for 5 days. 10 tablet Thedora Rings E, PA-C      PDMP not reviewed this encounter.   Providence Crosby, PA-C 12/09/23 1816

## 2023-12-09 NOTE — Discharge Instructions (Addendum)
It appears that you have an actively draining abscess along your rectal area.  It is important to allow this to continue to drain further manipulate the area as this can lead to increased infection.  At this time I recommend that he use warm compresses and warm sitz bath's to help encourage further drainage.  You can gently wipe the area as needed to remove discharge. I have sent in a medication called Bactrim for you to take twice per day for the next 5 days to assist with resolution. If your symptoms become worse, the area gets larger, starts to produce more significant drainage or bleeding please follow-up or go to the emergency room We will keep you updated on the results of your testing once it is available.  If medications are indicated by your test results those will be sent in to your pharmacy on file.

## 2023-12-09 NOTE — ED Triage Notes (Signed)
Patient rports that she has an abscess near her anus,but sttaes while she was sitting ut in the lobby it has started to drain.  Patient is also requesting STD testing and is having yellow vaginal discharge x 1-2 weeks.

## 2023-12-10 LAB — CERVICOVAGINAL ANCILLARY ONLY
Bacterial Vaginitis (gardnerella): POSITIVE — AB
Candida Glabrata: NEGATIVE
Candida Vaginitis: NEGATIVE
Chlamydia: NEGATIVE
Comment: NEGATIVE
Comment: NEGATIVE
Comment: NEGATIVE
Comment: NEGATIVE
Comment: NEGATIVE
Comment: NORMAL
Neisseria Gonorrhea: NEGATIVE
Trichomonas: NEGATIVE

## 2023-12-10 LAB — RPR: RPR Ser Ql: NONREACTIVE

## 2023-12-11 ENCOUNTER — Telehealth (HOSPITAL_COMMUNITY): Payer: Self-pay

## 2023-12-11 MED ORDER — METRONIDAZOLE 500 MG PO TABS: 500.0000 mg | ORAL_TABLET | Freq: Two times a day (BID) | ORAL | 0 refills | Status: DC

## 2023-12-11 NOTE — Telephone Encounter (Signed)
 Per protocol, pt requires tx with metronidazole. Rx sent to pharmacy on file.

## 2023-12-23 ENCOUNTER — Emergency Department (HOSPITAL_COMMUNITY)
Admission: EM | Admit: 2023-12-23 | Discharge: 2023-12-23 | Disposition: A | Discharge status: Home / Self Care | Payer: Medicaid Other | Attending: Emergency Medicine | Admitting: Emergency Medicine

## 2023-12-23 ENCOUNTER — Emergency Department (HOSPITAL_COMMUNITY): Payer: Medicaid Other

## 2023-12-23 ENCOUNTER — Other Ambulatory Visit: Payer: Self-pay

## 2023-12-23 DIAGNOSIS — G43809 Other migraine, not intractable, without status migrainosus: Secondary | ICD-10-CM

## 2023-12-23 DIAGNOSIS — J019 Acute sinusitis, unspecified: Secondary | ICD-10-CM

## 2023-12-23 DIAGNOSIS — R059 Cough, unspecified: Secondary | ICD-10-CM | POA: Diagnosis present

## 2023-12-23 LAB — RESP PANEL BY RT-PCR (RSV, FLU A&B, COVID)  RVPGX2
Influenza A by PCR: POSITIVE — AB
Influenza B by PCR: NEGATIVE
Resp Syncytial Virus by PCR: NEGATIVE
SARS Coronavirus 2 by RT PCR: NEGATIVE

## 2023-12-23 MED ORDER — PROCHLORPERAZINE EDISYLATE 10 MG/2ML IJ SOLN
10.0000 mg | Freq: Once | INTRAMUSCULAR | Status: AC
  Administered 2023-12-23: 10 mg via INTRAVENOUS
  Filled 2023-12-23: qty 2

## 2023-12-23 MED ORDER — DIPHENHYDRAMINE HCL 50 MG/ML IJ SOLN
12.5000 mg | Freq: Once | INTRAMUSCULAR | Status: AC
  Administered 2023-12-23: 12.5 mg via INTRAVENOUS
  Filled 2023-12-23: qty 1

## 2023-12-23 MED ORDER — AMOXICILLIN 500 MG PO CAPS: 500.0000 mg | ORAL_CAPSULE | Freq: Three times a day (TID) | ORAL | 0 refills | Status: DC

## 2023-12-23 MED ORDER — SODIUM CHLORIDE 0.9 % IV BOLUS
1000.0000 mL | Freq: Once | INTRAVENOUS | Status: AC
  Administered 2023-12-23: 1000 mL via INTRAVENOUS

## 2023-12-23 MED ORDER — DIPHENHYDRAMINE HCL 50 MG/ML IJ SOLN
INTRAMUSCULAR | Status: AC
  Filled 2023-12-23: qty 1

## 2023-12-23 MED ORDER — KETOROLAC TROMETHAMINE 15 MG/ML IJ SOLN
15.0000 mg | Freq: Once | INTRAMUSCULAR | Status: AC
  Administered 2023-12-23: 15 mg via INTRAVENOUS
  Filled 2023-12-23: qty 1

## 2023-12-23 NOTE — ED Triage Notes (Signed)
 C/o runny nose, productive cough with yellow/green sputum, and headache since yesterday.  Son also sick.

## 2023-12-23 NOTE — Discharge Instructions (Addendum)
 Your chest x-ray did not show any concerning findings.  You did have improvement in your headache with the medications in the emergency department.  Follow-up with your primary care provider.  Return for any concerning symptoms.  I have sent amoxicillin for sinus infection.  You can also perform a sinus rinse which is really helpful for sinus infections.

## 2023-12-23 NOTE — ED Provider Notes (Signed)
  EMERGENCY DEPARTMENT AT [facility] Provider Note   CSN: [id number] Arrival date & time: 12/23/23  0846     History  Chief Complaint  Patient presents with   Cough    Tara Nguyen is a 28 y.o. female.  28 year old female presents today for concern of sinus congestion, migraine headache.  She has history of migraine headaches.  Denies any speech change, vision change.  Findings sinus symptoms have been ongoing now for about a week and a half.  Denies fever.  Endorses productive cough.  The history is provided by the patient. No language interpreter was used.       Home Medications Prior to Admission medications   Medication Sig Start Date End Date Taking? Authorizing Provider  amoxicillin (AMOXIL) 500 MG capsule Take 1 capsule (500 mg total) by mouth 3 (three) times daily. 12/23/23  Yes Leeana Creer, PA-C  benzonatate (TESSALON) 100 MG capsule Take 1 capsule (100 mg total) by mouth 3 (three) times daily as needed for cough. 10/22/23   Zenia Resides, MD  Dextrose-Fructose-Sod Citrate Loletha Carrow) [phone] MG CHEW Chew 1 tablet by mouth 2 (two) times daily as needed (nausea).    [provider]  metroNIDAZOLE (FLAGYL) 500 MG tablet Take 1 tablet (500 mg total) by mouth 2 (two) times daily. 12/11/23   Zenia Resides, MD  norgestimate-ethinyl estradiol (ESTARYLLA) 0.25-35 MG-MCG tablet Take 1 tablet by mouth daily.    [provider]  VENTOLIN HFA 108 (90 Base) MCG/ACT inhaler Inhale 2 puffs into the lungs every 6 (six) hours as needed for nausea/vomiting. 12/22/21   [provider]      Allergies    Patient has no known allergies.    Review of Systems   Review of Systems  Constitutional:  Negative for chills and fever.  Eyes:  Negative for visual disturbance.  Respiratory:  Positive for cough. Negative for shortness of breath.   Cardiovascular:  Negative for chest pain.  Gastrointestinal:  Negative for abdominal pain.   Neurological:  Positive for headaches.  All other systems reviewed and are negative.   Physical Exam Updated Vital Signs BP 111/84   Pulse (!) 113   Temp 98.4 F (36.9 C)   Resp 18   LMP 12/02/2023   SpO2 100%  Physical Exam Vitals and nursing note reviewed.  Constitutional:      General: She is not in acute distress.    Appearance: Normal appearance. She is not ill-appearing.  HENT:     Head: Normocephalic and atraumatic.     Nose: Nose normal.  Eyes:     General: No scleral icterus.    Extraocular Movements: Extraocular movements intact.     Conjunctiva/sclera: Conjunctivae normal.  Cardiovascular:     Rate and Rhythm: Normal rate and regular rhythm.  Pulmonary:     Effort: Pulmonary effort is normal. No respiratory distress.  Abdominal:     General: There is no distension.     Tenderness: There is no abdominal tenderness.  Musculoskeletal:        General: Normal range of motion.     Cervical back: Normal range of motion.  Skin:    General: Skin is warm and dry.  Neurological:     General: No focal deficit present.     Mental Status: She is alert. Mental status is at baseline.     ED Results / Procedures / Treatments   Labs (all labs ordered are listed, but only abnormal results are  displayed) Labs Reviewed  RESP PANEL BY RT-PCR (RSV, FLU A&B, COVID)  [id number]    EKG None  Radiology DG Chest 2 View Result Date: 12/23/2023 CLINICAL DATA:  Productive cough. EXAM: CHEST - 2 VIEW COMPARISON:  05/08/2022. FINDINGS: Bilateral lung fields are clear. Bilateral costophrenic angles are clear. Normal cardio-mediastinal silhouette. No acute osseous abnormalities. The soft tissues are within normal limits. IMPRESSION: No active cardiopulmonary disease. Electronically Signed   By: Jules Schick M.D.   On: 12/23/2023 12:22    Procedures Procedures    Medications Ordered in ED Medications  diphenhydrAMINE (BENADRYL) 50 MG/ML injection (  Not Given 12/23/23 1125)   sodium chloride 0.9 % bolus 1,000 mL (0 mLs Intravenous Stopped 12/23/23 1257)  ketorolac (TORADOL) 15 MG/ML injection 15 mg (15 mg Intravenous Given 12/23/23 1124)  prochlorperazine (COMPAZINE) injection 10 mg (10 mg Intravenous Given 12/23/23 1124)  diphenhydrAMINE (BENADRYL) injection 12.5 mg (12.5 mg Intravenous Given 12/23/23 1123)    ED Course/ Medical Decision Making/ A&P                                 Medical Decision Making Amount and/or Complexity of Data Reviewed Radiology: ordered.  Risk Prescription drug management.   28 year old female presents today for concern of migraine headache.  Migraine cocktail given.  Headache improved.  Also complains of sinus infections ongoing for about a week and a half.  Amoxicillin prescribed.  Chest x-ray without acute concern for cardiopulmonary process.  Patient discharged in stable condition.  Return precaution discussed.  Patient voices understanding and is in agreement with plan. Respiratory panel pending.  Patient will follow-up on MyChart regarding the results. Presume she likely has a viral upper respiratory infection most likely influenza.  He is outside the window for Tamiflu.   Final Clinical Impression(s) / ED Diagnoses Final diagnoses:  Acute sinusitis, recurrence not specified, unspecified location  Other migraine without status migrainosus, not intractable    Rx / DC Orders ED Discharge Orders          Ordered    amoxicillin (AMOXIL) 500 MG capsule  3 times daily        12/23/23 1307              Marita Kansas, PA-C 12/23/23 1548    Wynetta Fines, MD 12/23/23 [phone]

## 2024-01-20 ENCOUNTER — Encounter (HOSPITAL_COMMUNITY): Payer: Self-pay | Admitting: *Deleted

## 2024-01-20 ENCOUNTER — Other Ambulatory Visit: Payer: Self-pay

## 2024-01-20 ENCOUNTER — Ambulatory Visit (HOSPITAL_COMMUNITY)
Admission: EM | Admit: 2024-01-20 | Discharge: 2024-01-20 | Disposition: A | Discharge status: Home / Self Care | Attending: Family Medicine | Admitting: Family Medicine

## 2024-01-20 DIAGNOSIS — N926 Irregular menstruation, unspecified: Secondary | ICD-10-CM | POA: Diagnosis not present

## 2024-01-20 DIAGNOSIS — R7989 Other specified abnormal findings of blood chemistry: Secondary | ICD-10-CM | POA: Diagnosis not present

## 2024-01-20 DIAGNOSIS — R112 Nausea with vomiting, unspecified: Secondary | ICD-10-CM | POA: Diagnosis present

## 2024-01-20 LAB — COMPREHENSIVE METABOLIC PANEL
ALT: 14 U/L (ref 0–44)
AST: 14 U/L — ABNORMAL LOW (ref 15–41)
Albumin: 3.7 g/dL (ref 3.5–5.0)
Alkaline Phosphatase: 49 U/L (ref 38–126)
Anion gap: 7 (ref 5–15)
BUN: 9 mg/dL (ref 6–20)
CO2: 25 mmol/L (ref 22–32)
Calcium: 9.6 mg/dL (ref 8.9–10.3)
Chloride: 106 mmol/L (ref 98–111)
Creatinine, Ser: 0.74 mg/dL (ref 0.44–1.00)
GFR, Estimated: >60 mL/min (ref >60)
Glucose, Bld: 71 mg/dL (ref 70–99)
Potassium: 4.2 mmol/L (ref 3.5–5.1)
Sodium: 138 mmol/L (ref 135–145)
Total Bilirubin: 0.3 mg/dL (ref 0.0–1.2)
Total Protein: 6.8 g/dL (ref 6.5–8.1)

## 2024-01-20 LAB — CBC
HCT: 41 % (ref 36.0–46.0)
Hemoglobin: 13.1 g/dL (ref 12.0–15.0)
MCH: 26 pg (ref 26.0–34.0)
MCHC: 32 g/dL (ref 30.0–36.0)
MCV: 81.5 fL (ref 80.0–100.0)
Platelets: 312 10*3/uL (ref 150–400)
RBC: 5.03 MIL/uL (ref 3.87–5.11)
RDW: 15.3 % (ref 11.5–15.5)
WBC: 8.2 10*3/uL (ref 4.0–10.5)
nRBC: 0 % (ref 0.0–0.2)

## 2024-01-20 LAB — POCT URINE PREGNANCY: Preg Test, Ur: NEGATIVE

## 2024-01-20 LAB — HCG, SERUM, QUALITATIVE: Preg, Serum: NEGATIVE

## 2024-01-20 MED ORDER — ONDANSETRON 4 MG PO TBDP: 4.0000 mg | ORAL_TABLET | Freq: Three times a day (TID) | ORAL | 0 refills | Status: DC | PRN

## 2024-01-20 NOTE — ED Provider Notes (Addendum)
 [facility]    CSN: [id number] Arrival date & time: 01/20/24  4098      History   Chief Complaint Chief Complaint  Patient presents with   Possible Pregnancy    HPI Tara Nguyen is a 28 y.o. female.    Possible Pregnancy  Here with nausea and vomiting for approximately 1 month.  She has had occasional days that she does not vomit at all but she threw up 2 or 3 times yesterday.  She continues to have bowel movements, but sometimes 4 loose and sometimes are hard and constipated.  No fever and no cough.  No dysuria and no abdominal pain  Last menstrual cycle was mid February, but it was light and short and more like a spotting.  She thinks she maybe had a normal period toward the end of January.  1 home pregnancy test was negative, done in late February.  NKDA  She has had some breast tenderness, but she had already had some galactorrhea and she was found to have a high prolactin level and is supposed to go see a neurologist.  She does have a primary provider  No mention of headache. Past Medical History:  Diagnosis Date   GDM (gestational diabetes mellitus) 07/2019   Hx of preeclampsia, prior pregnancy, currently pregnant 2020   Hx of trichomoniasis     Patient Active Problem List   Diagnosis Date Noted   Type 2 diabetes mellitus (HCC) 02/19/2022   Hyperemesis 02/08/2022   Preeclampsia in postpartum period 08/30/2019   Hives 08/30/2019   Gestational diabetes 08/30/2019   Shoulder dystocia during labor and delivery 08/30/2019   SVD (10/22) 08/28/2019   Normal labor 08/27/2019    Past Surgical History:  Procedure Laterality Date   NO PAST SURGERIES      OB History     Gravida  5   Para  1   Term  1   Preterm      AB  3   Living  1      SAB  2   IAB  1   Ectopic      Multiple  0   Live Births  1            Home Medications    Prior to Admission medications   Medication Sig Start Date End Date Taking?  Authorizing Provider  ondansetron (ZOFRAN-ODT) 4 MG disintegrating tablet Take 1 tablet (4 mg total) by mouth every 8 (eight) hours as needed for nausea or vomiting. 01/20/24  Yes Zenia Resides, MD  Dextrose-Fructose-Sod Citrate Loletha Carrow) [phone] MG CHEW Chew 1 tablet by mouth 2 (two) times daily as needed (nausea).    [provider]  VENTOLIN HFA 108 (90 Base) MCG/ACT inhaler Inhale 2 puffs into the lungs every 6 (six) hours as needed for nausea/vomiting. 12/22/21   [provider]    Family History History reviewed. No pertinent family history.  Social History Social History   Tobacco Use   Smoking status: Never   Smokeless tobacco: Never  Vaping Use   Vaping status: Never Used  Substance Use Topics   Alcohol use: Never   Drug use: Yes    Types: Marijuana     Allergies   Patient has no known allergies.   Review of Systems Review of Systems   Physical Exam Triage Vital Signs ED Triage Vitals  Encounter Vitals Group     BP 01/20/24 1033 118/82     Systolic BP Percentile --  Diastolic BP Percentile --      Pulse Rate 01/20/24 1033 81     Resp 01/20/24 1033 20     Temp 01/20/24 1033 98.2 F (36.8 C)     Temp src --      SpO2 01/20/24 1033 100 %     Weight --      Height --      Head Circumference --      Peak Flow --      Pain Score 01/20/24 1024 0     Pain Loc --      Pain Education --      Exclude from Growth Chart --    No data found.  Updated Vital Signs BP 118/82   Pulse 81   Temp 98.2 F (36.8 C)   Resp 20   LMP 12/07/2023   SpO2 100%   Visual Acuity Right Eye Distance:   Left Eye Distance:   Bilateral Distance:    Right Eye Near:   Left Eye Near:    Bilateral Near:     Physical Exam Vitals reviewed.  Constitutional:      General: She is not in acute distress.    Appearance: She is not ill-appearing, toxic-appearing or diaphoretic.  HENT:     Mouth/Throat:     Mouth: Mucous membranes are moist.  Eyes:      Extraocular Movements: Extraocular movements intact.     Conjunctiva/sclera: Conjunctivae normal.     Pupils: Pupils are equal, round, and reactive to light.  Cardiovascular:     Rate and Rhythm: Normal rate and regular rhythm.     Heart sounds: No murmur heard. Pulmonary:     Effort: Pulmonary effort is normal.     Breath sounds: Normal breath sounds.  Abdominal:     General: Bowel sounds are normal. There is no distension.     Palpations: Abdomen is soft. There is no mass.     Tenderness: There is no abdominal tenderness. There is no guarding.  Musculoskeletal:     Cervical back: Neck supple.  Lymphadenopathy:     Cervical: No cervical adenopathy.  Skin:    Coloration: Skin is not jaundiced or pale.  Neurological:     General: No focal deficit present.     Mental Status: She is alert and oriented to person, place, and time.  Psychiatric:        Behavior: Behavior normal.      UC Treatments / Results  Labs (all labs ordered are listed, but only abnormal results are displayed) Labs Reviewed  COMPREHENSIVE METABOLIC PANEL  CBC  PROLACTIN  HCG, SERUM, QUALITATIVE  POCT URINE PREGNANCY    EKG   Radiology No results found.  Procedures Procedures (including critical care time)  Medications Ordered in UC Medications - No data to display  Initial Impression / Assessment and Plan / UC Course  I have reviewed the triage vital signs and the nursing notes.  Pertinent labs & imaging results that were available during my care of the patient were reviewed by me and considered in my medical decision making (see chart for details).     UPT is negative.  CBC and CMP are drawn to evaluate this persistent nausea and vomiting she has had.  Prolactin level is repeated since she tells me that that has been elevated.  I did discuss with her that that elevation could affect her menstrual cycle pattern.  Serum pregnancy test is done as she is incredulous that her  UPT is  negative.  Staff will notify her if anything is significantly abnormal.  If she worsens in any way she is to go to the emergency room.  She is also to make a follow-up appointment with her primary care Final Clinical Impressions(s) / UC Diagnoses   Final diagnoses:  Irregular menstrual cycle  Nausea and vomiting, unspecified vomiting type  Elevated prolactin level     Discharge Instructions      The urine pregnancy test was negative  Ondansetron dissolved in the mouth every 8 hours as needed for nausea or vomiting. Clear liquids(water, gatorade/pedialyte, ginger ale/sprite, chicken broth/soup) and bland things(crackers/toast, rice, potato, bananas) to eat. Avoid acidic foods like lemon/lime/orange/tomato, and avoid greasy/spicy foods.  We have drawn blood to check your blood counts, electrolytes and sugar and kidney and liver function numbers.  We are also rechecking your prolactin level that you let us know was elevated in the past.  Our staff will notify you if anything is significantly abnormal.  If you worsen in any way or not improving, please go to emergency room.  Please follow-up with your primary care about these issues     ED Prescriptions     Medication Sig Dispense Auth. Provider   ondansetron (ZOFRAN-ODT) 4 MG disintegrating tablet Take 1 tablet (4 mg total) by mouth every 8 (eight) hours as needed for nausea or vomiting. 10 tablet Marlinda Mike Janace Aris, MD      PDMP not reviewed this encounter.   Zenia Resides, MD 01/20/24 1122    Zenia Resides, MD 01/20/24 [phone]

## 2024-01-20 NOTE — ED Triage Notes (Addendum)
 Pt reports possible pregnancy because period is late. PT reports she spotted toward the middle of FEB .but does not know dates. Pt is not taking BC.

## 2024-01-20 NOTE — ED Notes (Signed)
Third attempt. No response

## 2024-01-20 NOTE — ED Triage Notes (Signed)
 PT now a greeter's desk

## 2024-01-20 NOTE — ED Triage Notes (Signed)
PT called from front lobby no answer 

## 2024-01-20 NOTE — Discharge Instructions (Signed)
 The urine pregnancy test was negative  Ondansetron dissolved in the mouth every 8 hours as needed for nausea or vomiting. Clear liquids(water, gatorade/pedialyte, ginger ale/sprite, chicken broth/soup) and bland things(crackers/toast, rice, potato, bananas) to eat. Avoid acidic foods like lemon/lime/orange/tomato, and avoid greasy/spicy foods.  We have drawn blood to check your blood counts, electrolytes and sugar and kidney and liver function numbers.  We are also rechecking your prolactin level that you let us know was elevated in the past.  Our staff will notify you if anything is significantly abnormal.  If you worsen in any way or not improving, please go to emergency room.  Please follow-up with your primary care about these issues

## 2024-01-20 NOTE — ED Notes (Signed)
 First attempt in lobby. No response.

## 2024-01-21 LAB — PROLACTIN: Prolactin: 6.9 ng/mL (ref 4.8–33.4)

## 2024-03-22 ENCOUNTER — Ambulatory Visit (HOSPITAL_COMMUNITY): Admission: EM | Admit: 2024-03-22 | Discharge: 2024-03-22 | Disposition: A | Discharge status: Home / Self Care

## 2024-03-22 ENCOUNTER — Encounter (HOSPITAL_COMMUNITY): Payer: Self-pay | Admitting: Emergency Medicine

## 2024-03-22 ENCOUNTER — Other Ambulatory Visit: Payer: Self-pay

## 2024-03-22 DIAGNOSIS — J069 Acute upper respiratory infection, unspecified: Secondary | ICD-10-CM

## 2024-03-22 NOTE — ED Provider Notes (Signed)
 [facility]    CSN: [id number] Arrival date & time: 03/22/24  1229      History   Chief Complaint Chief Complaint  Patient presents with   Cough    HPI Tara Nguyen is a 28 y.o. female.   Patient presents with mild cough, runny nose, and congestion x 3 to 4 days.  Patient also endorses some mild intermittent diarrhea.  Denies fever, headache, sore throat, shortness of breath, chest pain, vomiting, and abdominal pain.  Mother states that she was recently exposed to someone with pneumonia and wanted to be sure that she does not also have pneumonia.  The history is provided by the patient and medical records.  Cough   Past Medical History:  Diagnosis Date   GDM (gestational diabetes mellitus) 07/2019   Hx of preeclampsia, prior pregnancy, currently pregnant 2020   Hx of trichomoniasis     Patient Active Problem List   Diagnosis Date Noted   Type 2 diabetes mellitus (HCC) 02/19/2022   Hyperemesis 02/08/2022   Preeclampsia in postpartum period 08/30/2019   Hives 08/30/2019   Gestational diabetes 08/30/2019   Shoulder dystocia during labor and delivery 08/30/2019   SVD (10/22) 08/28/2019   Normal labor 08/27/2019    Past Surgical History:  Procedure Laterality Date   NO PAST SURGERIES      OB History     Gravida  5   Para  1   Term  1   Preterm      AB  3   Living  1      SAB  2   IAB  1   Ectopic      Multiple  0   Live Births  1            Home Medications    Prior to Admission medications   Medication Sig Start Date End Date Taking? Authorizing Provider  Dextrose -Fructose-Sod Citrate (NAUZENE) [phone] MG CHEW Chew 1 tablet by mouth 2 (two) times daily as needed (nausea).    [provider]  ondansetron  (ZOFRAN -ODT) 4 MG disintegrating tablet Take 1 tablet (4 mg total) by mouth every 8 (eight) hours as needed for nausea or vomiting. 01/20/24   Ann Keto, MD  VENTOLIN  HFA 108 (90 Base) MCG/ACT  inhaler Inhale 2 puffs into the lungs every 6 (six) hours as needed for nausea/vomiting. 12/22/21   [provider]    Family History History reviewed. No pertinent family history.  Social History Social History   Tobacco Use   Smoking status: Never   Smokeless tobacco: Never  Vaping Use   Vaping status: Never Used  Substance Use Topics   Alcohol use: Never   Drug use: Yes    Types: Marijuana     Allergies   Patient has no known allergies.   Review of Systems Review of Systems  Respiratory:  Positive for cough.    Per HPI  Physical Exam Triage Vital Signs ED Triage Vitals  Encounter Vitals Group     BP 03/22/24 1332 125/80     Systolic BP Percentile --      Diastolic BP Percentile --      Pulse Rate 03/22/24 1332 80     Resp 03/22/24 1332 18     Temp 03/22/24 1332 98.2 F (36.8 C)     Temp Source 03/22/24 1332 Oral     SpO2 03/22/24 1332 98 %     Weight --      Height --  Head Circumference --      Peak Flow --      Pain Score 03/22/24 1333 0     Pain Loc --      Pain Education --      Exclude from Growth Chart --    No data found.  Updated Vital Signs BP 125/80 (BP Location: Right Arm)   Pulse 80   Temp 98.2 F (36.8 C) (Oral)   Resp 18   LMP 03/18/2024 (Exact Date)   SpO2 98%   Visual Acuity Right Eye Distance:   Left Eye Distance:   Bilateral Distance:    Right Eye Near:   Left Eye Near:    Bilateral Near:     Physical Exam Vitals and nursing note reviewed.  Constitutional:      General: She is awake. She is not in acute distress.    Appearance: Normal appearance. She is well-developed and well-groomed. She is not ill-appearing.  HENT:     Right Ear: Tympanic membrane, ear canal and external ear normal.     Left Ear: Tympanic membrane, ear canal and external ear normal.     Nose: Congestion and rhinorrhea present.     Mouth/Throat:     Mouth: Mucous membranes are moist.     Pharynx: Posterior oropharyngeal erythema and  postnasal drip present. No oropharyngeal exudate.     Tonsils: No tonsillar exudate.  Cardiovascular:     Rate and Rhythm: Normal rate and regular rhythm.  Pulmonary:     Effort: Pulmonary effort is normal.     Breath sounds: Normal breath sounds.  Skin:    General: Skin is warm and dry.  Neurological:     Mental Status: She is alert.  Psychiatric:        Behavior: Behavior is cooperative.      UC Treatments / Results  Labs (all labs ordered are listed, but only abnormal results are displayed) Labs Reviewed - No data to display  EKG   Radiology No results found.  Procedures Procedures (including critical care time)  Medications Ordered in UC Medications - No data to display  Initial Impression / Assessment and Plan / UC Course  I have reviewed the triage vital signs and the nursing notes.  Pertinent labs & imaging results that were available during my care of the patient were reviewed by me and considered in my medical decision making (see chart for details).     Patient is well-appearing.  Vitals are stable.  Upon assessment congestion and rhinorrhea are present, mild erythema and PND noted to pharynx.  Lungs clear bilaterally to auscultation.  Symptoms likely viral in nature.  Recommended over-the-counter medication as needed for symptoms.  Discussed return precautions. Final Clinical Impressions(s) / UC Diagnoses   Final diagnoses:  Viral URI with cough     Discharge Instructions      As discussed I believe your symptoms are likely related to a viral illness. You can take Robitussin or Delsym as needed for cough. Alternate between 650 mg of Tylenol  and 400 mg ibuprofen  every 6-8 hours as needed for any pain or fever. Make sure you are staying hydrated and getting plenty of rest. Return here if symptoms persist or worsen.  ED Prescriptions   None    PDMP not reviewed this encounter.   Levora Reas A, NP 03/22/24 1409

## 2024-03-22 NOTE — ED Triage Notes (Signed)
 Pt reports cough and running nose for the past few days.

## 2024-03-22 NOTE — Discharge Instructions (Signed)
 As discussed I believe your symptoms are likely related to a viral illness. You can take Robitussin or Delsym as needed for cough. Alternate between 650 mg of Tylenol  and 400 mg ibuprofen  every 6-8 hours as needed for any pain or fever. Make sure you are staying hydrated and getting plenty of rest. Return here if symptoms persist or worsen.

## 2024-03-31 ENCOUNTER — Encounter (HOSPITAL_COMMUNITY): Payer: Self-pay

## 2024-03-31 ENCOUNTER — Ambulatory Visit (HOSPITAL_COMMUNITY)
Admission: RE | Admit: 2024-03-31 | Discharge: 2024-03-31 | Disposition: A | Discharge status: Home / Self Care | Source: Ambulatory Visit | Attending: Family Medicine | Admitting: Family Medicine

## 2024-03-31 VITALS — BP 117/85 | HR 94 | Temp 98.4°F | Resp 16

## 2024-03-31 DIAGNOSIS — N898 Other specified noninflammatory disorders of vagina: Secondary | ICD-10-CM | POA: Diagnosis present

## 2024-03-31 DIAGNOSIS — Z113 Encounter for screening for infections with a predominantly sexual mode of transmission: Secondary | ICD-10-CM

## 2024-03-31 LAB — HIV ANTIBODY (ROUTINE TESTING W REFLEX): HIV Screen 4th Generation wRfx: NONREACTIVE

## 2024-03-31 NOTE — ED Triage Notes (Signed)
 Patient here today with c/o vaginal discharge X 2 weeks.

## 2024-03-31 NOTE — Discharge Instructions (Signed)
 We have sent testing for sexually transmitted infections. We will notify you of any positive results once they are received. If required, we will prescribe any medications you might need.  Please refrain from all sexual activity for at least the next seven days.

## 2024-03-31 NOTE — ED Provider Notes (Signed)
[facility]   [id number] 03/31/24 Arrival Time: 1405  ASSESSMENT & PLAN:  1. Vaginal discharge   2. Screening for STDs (sexually transmitted diseases)    Declines empiric gonorrhea/chlamydia treatment.    Discharge Instructions      We have sent testing for sexually transmitted infections. We will notify you of any positive results once they are received. If required, we will prescribe any medications you might need.  Please refrain from all sexual activity for at least the next seven days.    Without s/s of PID.  Labs Pending:  HIV ANTIBODY (ROUTINE TESTING W REFLEX)  RPR  CERVICOVAGINAL ANCILLARY ONLY   Reviewed expectations re: course of current medical issues. Questions answered. Outlined signs and symptoms indicating need for more acute intervention. Patient verbalized understanding. After Visit Summary given.   SUBJECTIVE:  Tara Nguyen is a 28 y.o. female who presents with complaint of vaginal discharge. Maybe 1-2 weeks; new sexual partner while on recent vacation.  Denies: urinary frequency, dysuria, and gross hematuria. Afebrile. No abdominal or pelvic pain. Normal PO intake wihout n/v.   Patient's last menstrual period was 03/18/2024 (exact date).   OBJECTIVE:  Vitals:   03/31/24 1440  BP: 117/85  Pulse: 94  Resp: 16  Temp: 98.4 F (36.9 C)  TempSrc: Oral  SpO2: 99%     General appearance: alert, cooperative, appears stated age and no distress GU: deferred Skin: warm and dry Psychological: alert and cooperative; normal mood and affect.    Labs Reviewed  HIV ANTIBODY (ROUTINE TESTING W REFLEX)  RPR  CERVICOVAGINAL ANCILLARY ONLY    No Known Allergies  Past Medical History:  Diagnosis Date   GDM (gestational diabetes mellitus) 07/2019   Hx of preeclampsia, prior pregnancy, currently pregnant 2020   Hx of trichomoniasis    History reviewed. No pertinent family history. Social History   Socioeconomic History    Marital status: Single    Spouse name: Not on file   Number of children: Not on file   Years of education: Not on file   Highest education level: Not on file  Occupational History   Not on file  Tobacco Use   Smoking status: Never   Smokeless tobacco: Never  Vaping Use   Vaping status: Never Used  Substance and Sexual Activity   Alcohol use: Never   Drug use: Yes    Types: Marijuana   Sexual activity: Yes    Birth control/protection: None  Other Topics Concern   Not on file  Social History Narrative   Not on file   Social Drivers of Health   Financial Resource Strain: Not on File (02/22/2022)   Received from [facility] [facility], [employer]    Financial Resource Strain: 0  Food Insecurity: Not on File (08/01/2023)   Received from [facility] Insecurity    Food: 0  Transportation Needs: Not on File (02/22/2022)   Received from [place], [employer] Needs    Transportation: 0  Physical Activity: Not on File (02/22/2022)   Received from [place], [place]   Physical Activity    Physical Activity: 0  Stress: Not on File (02/22/2022)   Received from [facility], [place]   Stress    Stress: 0  Social Connections: Not on File (07/15/2023)   Received from [facility]   Social Connections    Connectedness: 0  Intimate Partner Violence: Not on file           Dundas, Polly Brink, MD  03/31/24 1506  

## 2024-04-01 LAB — CERVICOVAGINAL ANCILLARY ONLY
Bacterial Vaginitis (gardnerella): POSITIVE — AB
Candida Glabrata: NEGATIVE
Candida Vaginitis: POSITIVE — AB
Chlamydia: NEGATIVE
Comment: NEGATIVE
Comment: NEGATIVE
Comment: NEGATIVE
Comment: NEGATIVE
Comment: NEGATIVE
Comment: NORMAL
Neisseria Gonorrhea: NEGATIVE
Trichomonas: NEGATIVE

## 2024-04-01 LAB — RPR: RPR Ser Ql: NONREACTIVE

## 2024-04-02 ENCOUNTER — Ambulatory Visit (HOSPITAL_COMMUNITY): Payer: Self-pay

## 2024-04-08 MED ORDER — FLUCONAZOLE 150 MG PO TABS
150.0000 mg | ORAL_TABLET | Freq: Once | ORAL | 0 refills | Status: DC
Start: 2024-04-08 — End: 2024-04-13

## 2024-04-08 MED ORDER — METRONIDAZOLE 0.75 % VA GEL: 1.0000 | Freq: Every day | VAGINAL | 0 refills | Status: DC

## 2024-04-13 MED ORDER — METRONIDAZOLE 0.75 % VA GEL: 1.0000 | Freq: Every day | VAGINAL | 0 refills | Status: AC

## 2024-04-13 MED ORDER — FLUCONAZOLE 150 MG PO TABS: 150.0000 mg | ORAL_TABLET | Freq: Once | ORAL | 0 refills | Status: AC

## 2024-04-13 NOTE — Addendum Note (Signed)
 Addended by: Sharilyn Davenport on: 04/13/2024 08:49 AM   Modules accepted: Orders

## 2024-04-13 NOTE — Telephone Encounter (Signed)
Pt called requesting pharmacy change. 

## 2024-10-23 ENCOUNTER — Ambulatory Visit (HOSPITAL_COMMUNITY)
Admission: EM | Admit: 2024-10-23 | Discharge: 2024-10-23 | Disposition: A | Discharge status: Home / Self Care | Attending: Family Medicine | Admitting: Family Medicine

## 2024-10-23 ENCOUNTER — Encounter (HOSPITAL_COMMUNITY): Payer: Self-pay

## 2024-10-23 DIAGNOSIS — L243 Irritant contact dermatitis due to cosmetics: Secondary | ICD-10-CM

## 2024-10-23 MED ORDER — PREDNISONE 20 MG PO TABS: 40.0000 mg | ORAL_TABLET | Freq: Every day | ORAL | 0 refills | Status: AC

## 2024-10-23 MED ORDER — TRIAMCINOLONE ACETONIDE 0.1 % EX CREA: 1.0000 | TOPICAL_CREAM | Freq: Two times a day (BID) | CUTANEOUS | 0 refills | Status: AC

## 2024-10-23 NOTE — ED Triage Notes (Signed)
 Patient presenting with burning in the eyes, skin peeling, and soreness around the face onset yesterday. States woke up this morning and both eyes are swollen.   States she tried some vaginal wash but used it on her face.

## 2024-10-23 NOTE — Discharge Instructions (Addendum)
 Take prednisone 20 mg--2 daily for 5 days  Triamcinolone cream--apply 2 times daily to the rash area until better, about 10 to 14 days.  Please do not use it longer than 2 weeks on your face or neck.  Also use a moisturizing cream or lotion on your face and neck, preferably one that is unscented.

## 2024-10-23 NOTE — ED Provider Notes (Signed)
 " [facility]    CSN: [id number] Arrival date & time: 10/23/24  0809      History   Chief Complaint Chief Complaint  Patient presents with   Allergic Reaction   Rash    HPI Tara Nguyen is a 28 y.o. female.    Allergic Reaction Presenting symptoms: rash   Rash  Here for rash and irritation and burning and a little itching on her face.  It began yesterday and then was worse this morning with her eyes being swollen.  Yesterday she used a Vagisil vaginal wash on her face.  NKDA  Last menstrual cycle December 11  Past Medical History:  Diagnosis Date   GDM (gestational diabetes mellitus) 07/2019   Hx of preeclampsia, prior pregnancy, currently pregnant 2020   Hx of trichomoniasis     Patient Active Problem List   Diagnosis Date Noted   Type 2 diabetes mellitus (HCC) 02/19/2022   Hyperemesis 02/08/2022   Preeclampsia in postpartum period 08/30/2019   Hives 08/30/2019   Gestational diabetes 08/30/2019   Shoulder dystocia during labor and delivery 08/30/2019   SVD (10/22) 08/28/2019   Normal labor 08/27/2019    Past Surgical History:  Procedure Laterality Date   NO PAST SURGERIES      OB History     Gravida  5   Para  1   Term  1   Preterm      AB  3   Living  1      SAB  2   IAB  1   Ectopic      Multiple  0   Live Births  1            Home Medications    Prior to Admission medications  Medication Sig Start Date End Date Taking? Authorizing Provider  predniSONE (DELTASONE) 20 MG tablet Take 2 tablets (40 mg total) by mouth daily with breakfast for 5 days. 10/23/24 10/28/24 Yes Castella Lerner K, MD  triamcinolone cream (KENALOG) 0.1 % Apply 1 Application topically 2 (two) times daily. To affected area till better 10/23/24  Yes Geryl Dohn, Sharlet POUR, MD  VENTOLIN  HFA 108 [phone] Base) MCG/ACT inhaler Inhale 2 puffs into the lungs every 6 (six) hours as needed for nausea/vomiting. 12/22/21   [provider]     Family History History reviewed. No pertinent family history.  Social History Social History[1]   Allergies   Patient has no known allergies.   Review of Systems Review of Systems  Skin:  Positive for rash.     Physical Exam Triage Vital Signs ED Triage Vitals  Encounter Vitals Group     BP 10/23/24 0823 117/83     Girls Systolic BP Percentile --      Girls Diastolic BP Percentile --      Boys Systolic BP Percentile --      Boys Diastolic BP Percentile --      Pulse Rate 10/23/24 0823 80     Resp 10/23/24 0823 18     Temp 10/23/24 0823 98.7 F (37.1 C)     Temp Source 10/23/24 0823 Oral     SpO2 10/23/24 0823 97 %     Weight 10/23/24 0823 163 lb (73.9 kg)     Height 10/23/24 0823 5' 1 (1.549 m)     Head Circumference --      Peak Flow --      Pain Score 10/23/24 0822 8     Pain Loc --  Pain Education --      Exclude from Growth Chart --    No data found.  Updated Vital Signs BP 117/83 (BP Location: Right Arm)   Pulse 80   Temp 98.7 F (37.1 C) (Oral)   Resp 18   Ht 5' 1 (1.549 m)   Wt 73.9 kg   LMP 10/15/2024 (Exact Date)   SpO2 97%   BMI 30.80 kg/m   Visual Acuity Right Eye Distance:   Left Eye Distance:   Bilateral Distance:    Right Eye Near:   Left Eye Near:    Bilateral Near:     Physical Exam Vitals reviewed.  Constitutional:      General: She is not in acute distress.    Appearance: She is not ill-appearing, toxic-appearing or diaphoretic.  HENT:     Mouth/Throat:     Mouth: Mucous membranes are moist.  Cardiovascular:     Rate and Rhythm: Normal rate and regular rhythm.  Pulmonary:     Effort: Pulmonary effort is normal.     Breath sounds: Normal breath sounds.  Skin:    Coloration: Skin is not jaundiced or pale.     Comments: There is patchy rash on both cheeks and onto her neck.  The patches are about 2 x 1 cm.  There is no drainage.  No lymphadenopathy.  See photos  Her eyelids are little puffy  Neurological:      General: No focal deficit present.     Mental Status: She is alert and oriented to person, place, and time.  Psychiatric:        Behavior: Behavior normal.        UC Treatments / Results  Labs (all labs ordered are listed, but only abnormal results are displayed) Labs Reviewed - No data to display  EKG   Radiology No results found.  Procedures Procedures (including critical care time)  Medications Ordered in UC Medications - No data to display  Initial Impression / Assessment and Plan / UC Course  I have reviewed the triage vital signs and the nursing notes.  Pertinent labs & imaging results that were available during my care of the patient were reviewed by me and considered in my medical decision making (see chart for details).     5-day burst of prednisone is sent in and triamcinolone is sent for her to use twice daily for a brief time.  She will also use an emollient cream or lotion, and she decided she will never use this product again.  The product does have salicylic acid, and I wonder if that irritated her face a lot.  Final Clinical Impressions(s) / UC Diagnoses   Final diagnoses:  Irritant contact dermatitis due to cosmetics     Discharge Instructions      Take prednisone 20 mg--2 daily for 5 days  Triamcinolone cream--apply 2 times daily to the rash area until better, about 10 to 14 days.  Please do not use it longer than 2 weeks on your face or neck.  Also use a moisturizing cream or lotion on your face and neck, preferably one that is unscented.      ED Prescriptions     Medication Sig Dispense Auth. Provider   predniSONE (DELTASONE) 20 MG tablet Take 2 tablets (40 mg total) by mouth daily with breakfast for 5 days. 10 tablet Manav Pierotti K, MD   triamcinolone cream (KENALOG) 0.1 % Apply 1 Application topically 2 (two) times daily. To affected area  till better 80 g Vonna Sharlet POUR, MD      PDMP not reviewed this encounter.    [1]   Social History Tobacco Use   Smoking status: Never   Smokeless tobacco: Never  Vaping Use   Vaping status: Never Used  Substance Use Topics   Alcohol use: Never   Drug use: Yes    Types: Marijuana     Vonna Sharlet POUR, MD 10/23/24 [phone]  "
